# Patient Record
Sex: Male | Born: 1975 | Hispanic: Yes | Marital: Single | Smoking: Never smoker
Health system: Southern US, Community
[De-identification: ages and names within clinical notes are randomized; demographics above are authoritative.]

## PROBLEM LIST (undated history)

## (undated) DIAGNOSIS — I10 Essential (primary) hypertension: Secondary | ICD-10-CM

## (undated) DIAGNOSIS — K469 Unspecified abdominal hernia without obstruction or gangrene: Secondary | ICD-10-CM

## (undated) DIAGNOSIS — E119 Type 2 diabetes mellitus without complications: Secondary | ICD-10-CM

---

## 2014-01-29 ENCOUNTER — Emergency Department (HOSPITAL_COMMUNITY): Payer: Self-pay

## 2014-01-29 ENCOUNTER — Emergency Department (HOSPITAL_COMMUNITY)
Admission: EM | Admit: 2014-01-29 | Discharge: 2014-01-30 | Disposition: A | Discharge status: Home / Self Care | Payer: Self-pay | Attending: Emergency Medicine | Admitting: Emergency Medicine

## 2014-01-29 ENCOUNTER — Encounter (HOSPITAL_COMMUNITY): Payer: Self-pay | Admitting: Emergency Medicine

## 2014-01-29 DIAGNOSIS — I10 Essential (primary) hypertension: Secondary | ICD-10-CM | POA: Insufficient documentation

## 2014-01-29 DIAGNOSIS — K469 Unspecified abdominal hernia without obstruction or gangrene: Secondary | ICD-10-CM | POA: Insufficient documentation

## 2014-01-29 DIAGNOSIS — E119 Type 2 diabetes mellitus without complications: Secondary | ICD-10-CM | POA: Insufficient documentation

## 2014-01-29 DIAGNOSIS — K429 Umbilical hernia without obstruction or gangrene: Secondary | ICD-10-CM | POA: Insufficient documentation

## 2014-01-29 HISTORY — DX: Type 2 diabetes mellitus without complications: E11.9

## 2014-01-29 HISTORY — DX: Essential (primary) hypertension: I10

## 2014-01-29 HISTORY — DX: Unspecified abdominal hernia without obstruction or gangrene: K46.9

## 2014-01-29 LAB — CBC WITH DIFFERENTIAL/PLATELET
BASOS ABS: 0 10*3/uL (ref 0.0–0.1)
BASOS PCT: 0 % (ref 0–1)
Eosinophils Absolute: 0 10*3/uL (ref 0.0–0.7)
Eosinophils Relative: 0 % (ref 0–5)
HCT: 42.2 % (ref 39.0–52.0)
Hemoglobin: 14.3 g/dL (ref 13.0–17.0)
Lymphocytes Relative: 15 % (ref 12–46)
Lymphs Abs: 1.6 10*3/uL (ref 0.7–4.0)
MCH: 28.6 pg (ref 26.0–34.0)
MCHC: 33.9 g/dL (ref 30.0–36.0)
MCV: 84.4 fL (ref 78.0–100.0)
Monocytes Absolute: 0.4 10*3/uL (ref 0.1–1.0)
Monocytes Relative: 4 % (ref 3–12)
NEUTROS ABS: 8.4 10*3/uL — AB (ref 1.7–7.7)
Neutrophils Relative %: 81 % — ABNORMAL HIGH (ref 43–77)
Platelets: 246 10*3/uL (ref 150–400)
RBC: 5 MIL/uL (ref 4.22–5.81)
RDW: 12.5 % (ref 11.5–15.5)
WBC: 10.5 10*3/uL (ref 4.0–10.5)

## 2014-01-29 LAB — URINALYSIS, ROUTINE W REFLEX MICROSCOPIC
Bilirubin Urine: NEGATIVE
GLUCOSE, UA: NEGATIVE mg/dL
LEUKOCYTES UA: NEGATIVE
Nitrite: NEGATIVE
PH: 7 (ref 5.0–8.0)
SPECIFIC GRAVITY, URINE: 1.02 (ref 1.005–1.030)
Urobilinogen, UA: 1 mg/dL (ref 0.0–1.0)

## 2014-01-29 LAB — URINE MICROSCOPIC-ADD ON

## 2014-01-29 MED ORDER — SODIUM CHLORIDE 0.9 % IV SOLN
1000.0000 mL | Freq: Once | INTRAVENOUS | Status: AC
  Administered 2014-01-30: 1000 mL via INTRAVENOUS

## 2014-01-29 MED ORDER — MORPHINE SULFATE 4 MG/ML IJ SOLN
4.0000 mg | Freq: Once | INTRAMUSCULAR | Status: AC
  Administered 2014-01-30: 4 mg via INTRAVENOUS
  Filled 2014-01-29: qty 1

## 2014-01-29 MED ORDER — ONDANSETRON HCL 4 MG/2ML IJ SOLN
4.0000 mg | Freq: Once | INTRAMUSCULAR | Status: AC
  Administered 2014-01-30: 4 mg via INTRAVENOUS
  Filled 2014-01-29: qty 2

## 2014-01-29 MED ORDER — SODIUM CHLORIDE 0.9 % IV SOLN
1000.0000 mL | INTRAVENOUS | Status: DC
  Administered 2014-01-30: 1000 mL via INTRAVENOUS

## 2014-01-29 NOTE — ED Notes (Signed)
Pt reports lower abdominal pain. Pt states his hernia is not normally protruding out like it is now. Pt tender in the lower abdomen.

## 2014-01-29 NOTE — ED Notes (Signed)
Had the hernia for 4 years and it started bothering him real bad this morning. Denies bowel and bladder problems, states he has a little nausea, no vomiting.

## 2014-01-29 NOTE — ED Provider Notes (Signed)
CSN: [id number]     Arrival date & time 01/29/14  2021 History   First MD Initiated Contact with Patient 01/29/14 2258   This chart was scribed for Ward GivensIva L Ole Lafon, MD by Gwenevere AbbotAlexis Brown, ED scribe. This patient was seen in room APA03/APA03 and the patient's care was started at 11:28 PM.    Chief Complaint  Patient presents with  . Hernia   The history is provided by the patient and a relative. No language interpreter was used.   HPI Comments:  Christopher Compton is a 38 y.o. male who presents to the Emergency Department complaining of  periumbilical abdominal pain from a periumbilical hernia that has persisted for 5 to 6 years, but worsened today. He states normally the hernia will come out but will also go back in. Today however the hernia has stayed out and feels hard. Pt states that he began to experience increased pain, with associated symptoms of sweating and nausea at 3:00 PM. Pt reports that he did have a bowel movement today at 6:00PM. Pt states that rest improves pain. He states changing positions makes the pain worse. He denies vomiting or diarrhea. Pt states that he is able to sleep with pain as well. 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 [facility]Pt states that he was taking medication for Diabtes, but was stopped by Physician recently, now he only takes medication for HTN. Pt reports that he has never talked to surgeon before about hernia removal. Today is the first time pt reports that hernia had bothered him this severely.   PCP [facility]  Past Medical History  Diagnosis Date  . Abdominal hernia   . Hypertension   . Diabetes mellitus without complication    History reviewed. No pertinent past surgical history. No family history on file. History  Substance Use Topics  . Smoking status: Never Smoker   . Smokeless tobacco: Not on file  . Alcohol Use: No  employed  Review of Systems    Constitutional: Negative for fever and chills.  Gastrointestinal: Positive for abdominal pain. Negative for nausea, vomiting and diarrhea.      Allergies  Review of patient's allergies indicates no known allergies.  Home Medications  Fish oil BP medication Diabetes medication stopped 2-3 weeks ago  BP 127/80  Pulse 57  Temp(Src) 98.2 F (36.8 C) (Oral)  Resp 18  Ht 5\' 9"  (1.753 m)  Wt 236 lb (107.049 kg)  BMI 34.84 kg/m2  SpO2 100%  Vital signs normal except bradycardia  Physical Exam  Nursing note and vitals reviewed. Constitutional: He is oriented to person, place, and time. He appears well-developed and well-nourished.  Non-toxic appearance. He does not appear ill. He appears distressed.  HENT:  Head: Normocephalic and atraumatic.  Right Ear: External ear normal.  Left Ear: External ear normal.  Nose: Nose normal. No mucosal edema or rhinorrhea.  Mouth/Throat: Oropharynx is clear and moist and mucous membranes are normal. No dental abscesses or uvula swelling.  Eyes: Conjunctivae and EOM are normal. Pupils are equal, round, and reactive to light.  Neck: Normal range of motion and full passive range of motion without pain. Neck supple.  Cardiovascular: Normal rate, regular rhythm and normal heart sounds.  Exam reveals no gallop and no friction rub.   No murmur heard. Pulmonary/Chest: Effort normal and breath sounds normal. No respiratory distress. He has no wheezes. He has no rhonchi. He  has no rales. He exhibits no tenderness and no crepitus.  Abdominal: Soft. Normal appearance and bowel sounds are normal. He exhibits no distension. There is tenderness. There is no rebound and no guarding.  4 by 5 cm umbilical hernia, firm to touch and painful. Diffuse abdominal pain. I attempted to reduce the hernia however it did not respond.  Musculoskeletal: Normal range of motion. He exhibits no edema and no tenderness.  Moves all extremities well.   Neurological: He is alert and  oriented to person, place, and time. He has normal strength. No cranial nerve deficit.  Skin: Skin is warm, dry and intact. No rash noted. No erythema. No pallor.  Psychiatric: He has a normal mood and affect. His speech is normal and behavior is normal. His mood appears not anxious.        ED Course  Procedures   Medications  0.9 %  sodium chloride infusion (0 mLs Intravenous Stopped 01/30/14 0130)    Followed by  0.9 %  sodium chloride infusion (1,000 mLs Intravenous New Bag/Given 01/30/14 0000)  morphine 4 MG/ML injection 4 mg (4 mg Intravenous Given 01/30/14 0004)  ondansetron (ZOFRAN) injection 4 mg (4 mg Intravenous Given 01/30/14 0001)  iohexol (OMNIPAQUE) 300 MG/ML solution 50 mL (50 mLs Oral Contrast Given 01/30/14 0118)  iohexol (OMNIPAQUE) 300 MG/ML solution 100 mL (100 mLs Intravenous Contrast Given 01/30/14 0117)  HYDROmorphone (DILAUDID) injection 1 mg (1 mg Intravenous Given 01/30/14 0147)    DIAGNOSTIC STUDIES: Oxygen Saturation is 100% on RA, normal by my interpretation.  COORDINATION OF CARE: 11:34 PM-Discussed treatment plan with pt at bedside and pt agreed to plan.  01:38 Radiologist call CT results. States it appears the hernia could be reducible. I will attempt again after giving more pain medications.  02:05 Pt given more pain medications and placed in Trendelenburg position. His hernia was reduced by me.   03:00 hernia has remained reduced. Pt states his pain is better, feels ready to be discharged.  We discussed to not strain or lift heavy objects and the need to see a surgeon to have surgery done to repair the hernia.   Labs Review Results for orders placed during the hospital encounter of 01/29/14  CBC WITH DIFFERENTIAL      Result Value Ref Range   WBC 10.5  4.0 - 10.5 K/uL   RBC 5.00  4.22 - 5.81 MIL/uL   Hemoglobin 14.3  13.0 - 17.0 g/dL   HCT 16.1  09.6 - 04.5 %   MCV 84.4  78.0 - 100.0 fL   MCH 28.6  26.0 - 34.0 pg   MCHC 33.9  30.0 - 36.0 g/dL   RDW  40.9  81.1 - 91.4 %   Platelets 246  150 - 400 K/uL   Neutrophils Relative % 81 (*) 43 - 77 %   Neutro Abs 8.4 (*) 1.7 - 7.7 K/uL   Lymphocytes Relative 15  12 - 46 %   Lymphs Abs 1.6  0.7 - 4.0 K/uL   Monocytes Relative 4  3 - 12 %   Monocytes Absolute 0.4  0.1 - 1.0 K/uL   Eosinophils Relative 0  0 - 5 %   Eosinophils Absolute 0.0  0.0 - 0.7 K/uL   Basophils Relative 0  0 - 1 %   Basophils Absolute 0.0  0.0 - 0.1 K/uL  COMPREHENSIVE METABOLIC PANEL      Result Value Ref Range   Sodium 139  137 - 147 mEq/L  Potassium 4.2  3.7 - 5.3 mEq/L   Chloride 100  96 - 112 mEq/L   CO2 28  19 - 32 mEq/L   Glucose, Bld 103 (*) 70 - 99 mg/dL   BUN 15  6 - 23 mg/dL   Creatinine, Ser 1.61  0.50 - 1.35 mg/dL   Calcium 9.6  8.4 - 09.6 mg/dL   Total Protein 7.1  6.0 - 8.3 g/dL   Albumin 4.2  3.5 - 5.2 g/dL   AST 17  0 - 37 U/L   ALT 18  0 - 53 U/L   Alkaline Phosphatase 73  39 - 117 U/L   Total Bilirubin 0.4  0.3 - 1.2 mg/dL   GFR calc non Af Amer >90  >90 mL/min   GFR calc Af Amer >90  >90 mL/min   Anion gap 11  5 - 15  URINALYSIS, ROUTINE W REFLEX MICROSCOPIC      Result Value Ref Range   Color, Urine YELLOW  YELLOW   APPearance CLEAR  CLEAR   Specific Gravity, Urine 1.020  1.005 - 1.030   pH 7.0  5.0 - 8.0   Glucose, UA NEGATIVE  NEGATIVE mg/dL   Hgb urine dipstick TRACE (*) NEGATIVE   Bilirubin Urine NEGATIVE  NEGATIVE   Ketones, ur TRACE (*) NEGATIVE mg/dL   Protein, ur TRACE (*) NEGATIVE mg/dL   Urobilinogen, UA 1.0  0.0 - 1.0 mg/dL   Nitrite NEGATIVE  NEGATIVE   Leukocytes, UA NEGATIVE  NEGATIVE  URINE MICROSCOPIC-ADD ON      Result Value Ref Range   Squamous Epithelial / LPF RARE  RARE   WBC, UA 3-6  <3 WBC/hpf   RBC / HPF 7-10  <3 RBC/hpf   Bacteria, UA FEW (*) RARE   Urine-Other MUCOUS PRESENT     Laboratory interpretation all normal       Imaging Review Ct Abdomen Pelvis W Contrast  01/30/2014   CLINICAL DATA:  Abdominal pain; known umbilical hernia.  EXAM: CT  ABDOMEN AND PELVIS WITH CONTRAST  TECHNIQUE: Multidetector CT imaging of the abdomen and pelvis was performed using the standard protocol following bolus administration of intravenous contrast.  CONTRAST:  OMNIPAQUE IOHEXOL 300 MG/ML SOLN, 50mL OMNIPAQUE IOHEXOL 300 MG/ML SOLN  COMPARISON:  None.  FINDINGS: The visualized lung bases are clear.  The liver and spleen are unremarkable in appearance. The gallbladder is within normal limits. The pancreas and adrenal glands are unremarkable.  The kidneys are unremarkable in appearance. There is no evidence of hydronephrosis. No renal or ureteral stones are seen. No perinephric stranding is appreciated.  There is a small to moderate umbilical hernia, containing a short segment of dilated mid ileum, with associated free fluid. The transition point is at the distal aspect of the herniated segment. More proximal small bowel measures up to 3.4 cm in maximal diameter. This is compatible with focal bowel obstruction due to bowel herniation, and may still be manually reducible, given the somewhat broad neck of the hernia.  No additional free fluid is identified. The more distal small bowel is decompressed. The stomach is within normal limits. No acute vascular abnormalities are seen.  The appendix is normal in caliber and contains air, without evidence for appendicitis. The colon is largely decompressed and is grossly unremarkable in appearance.  The bladder is decompressed and not well assessed. The prostate is normal in size. No inguinal lymphadenopathy is seen.  No acute osseous abnormalities are identified. A prominent calcified posterior disc  protrusion is noted at L4-L5; there may be mild impression on the exiting nerve root on the right side, reflecting associated bony foraminal narrowing.  IMPRESSION: 1. Focal small-bowel obstruction noted at the mid ileum, reflecting herniation of a short segment of mid ileum into a small to moderate umbilical hernia. Associated  free fluid seen within the hernia. More proximal small bowel is dilated, up to 3.4 cm in maximal diameter. This may still be manually reducible, given the somewhat broad neck of the hernia. 2. Prominent calcified posterior disc protrusion at L4-L5; there may be mild impression on the exiting nerve root on the right side, reflecting associated bony foraminal narrowing. Would correlate for associated right lower extremity symptoms.  These results were called by telephone at the time of interpretation on 01/30/2014 at 1:45 am to Dr. Devoria Albe, who verbally acknowledged these results.   Electronically Signed   By: Roanna Raider M.D.   On: 01/30/2014 01:45     EKG Interpretation None      MDM   Final diagnoses:  Periumbilical hernia    New Prescriptions   ONDANSETRON (ZOFRAN) 4 MG TABLET    Take 1 tablet (4 mg total) by mouth every 8 (eight) hours as needed for nausea or vomiting.   OXYCODONE-ACETAMINOPHEN (PERCOCET/ROXICET) 5-325 MG PER TABLET    Take 1 tablet by mouth every 6 (six) hours as needed for moderate pain or severe pain.    Plan discharge    I personally performed the services described in this documentation, which was scribed in my presence. The recorded information has been reviewed and considered.  Devoria Albe, MD, FACEP      Ward Givens, MD 01/30/14 [phone]

## 2014-01-30 LAB — COMPREHENSIVE METABOLIC PANEL
ALBUMIN: 4.2 g/dL (ref 3.5–5.2)
ALK PHOS: 73 U/L (ref 39–117)
ALT: 18 U/L (ref 0–53)
AST: 17 U/L (ref 0–37)
Anion gap: 11 (ref 5–15)
BUN: 15 mg/dL (ref 6–23)
CHLORIDE: 100 meq/L (ref 96–112)
CO2: 28 mEq/L (ref 19–32)
CREATININE: 0.96 mg/dL (ref 0.50–1.35)
Calcium: 9.6 mg/dL (ref 8.4–10.5)
GFR calc Af Amer: >90 mL/min (ref >90)
GFR calc non Af Amer: >90 mL/min (ref >90)
Glucose, Bld: 103 mg/dL — ABNORMAL HIGH (ref 70–99)
POTASSIUM: 4.2 meq/L (ref 3.7–5.3)
Sodium: 139 mEq/L (ref 137–147)
Total Bilirubin: 0.4 mg/dL (ref 0.3–1.2)
Total Protein: 7.1 g/dL (ref 6.0–8.3)

## 2014-01-30 MED ORDER — IOHEXOL 300 MG/ML  SOLN
50.0000 mL | Freq: Once | INTRAMUSCULAR | Status: AC | PRN
  Administered 2014-01-30: 50 mL via ORAL

## 2014-01-30 MED ORDER — IOHEXOL 300 MG/ML  SOLN
100.0000 mL | Freq: Once | INTRAMUSCULAR | Status: AC | PRN
  Administered 2014-01-30: 100 mL via INTRAVENOUS

## 2014-01-30 MED ORDER — HYDROMORPHONE HCL PF 1 MG/ML IJ SOLN
1.0000 mg | Freq: Once | INTRAMUSCULAR | Status: AC
  Administered 2014-01-30: 1 mg via INTRAVENOUS
  Filled 2014-01-30: qty 1

## 2014-01-30 MED ORDER — OXYCODONE-ACETAMINOPHEN 5-325 MG PO TABS: 1.0000 | ORAL_TABLET | Freq: Four times a day (QID) | ORAL | Status: DC | PRN

## 2014-01-30 MED ORDER — ONDANSETRON HCL 4 MG PO TABS: 4.0000 mg | ORAL_TABLET | Freq: Three times a day (TID) | ORAL | Status: DC | PRN

## 2014-01-30 NOTE — ED Notes (Signed)
Pt placed in Trendelenburg position as per order.

## 2014-01-30 NOTE — Discharge Instructions (Signed)
Avoid lifting heavy objects, straining or coughing as this can make the hernia get stuck out again. Take the percocet as needed for pain. Return to the ED if you are unable to get the hernia to go away. [facility] provider [facility] to arrange to discuss having your hernia repaired by a surgeon. Return to the ED if it gets stuck out again and gets more painful.

## 2014-01-30 NOTE — ED Notes (Signed)
Pt alert & oriented x4, stable gait. Patient given discharge instructions, paperwork & prescription(s). Patient  instructed to stop at the registration desk to finish any additional paperwork. Patient verbalized understanding. Pt left department w/ no further questions. 

## 2014-02-02 ENCOUNTER — Encounter (HOSPITAL_COMMUNITY)
Admission: RE | Admit: 2014-02-02 | Discharge: 2014-02-02 | Disposition: A | Discharge status: Home / Self Care | Payer: Self-pay | Source: Ambulatory Visit | Attending: General Surgery | Admitting: General Surgery

## 2014-02-02 ENCOUNTER — Encounter (HOSPITAL_COMMUNITY): Payer: Self-pay | Admitting: Pharmacy Technician

## 2014-02-02 ENCOUNTER — Encounter (HOSPITAL_COMMUNITY): Payer: Self-pay

## 2014-02-02 DIAGNOSIS — R9431 Abnormal electrocardiogram [ECG] [EKG]: Secondary | ICD-10-CM | POA: Insufficient documentation

## 2014-02-02 DIAGNOSIS — Z01810 Encounter for preprocedural cardiovascular examination: Secondary | ICD-10-CM | POA: Insufficient documentation

## 2014-02-02 NOTE — Consult Note (Signed)
NAMJonni Compton:  [name], [name]              ACCOUNT NO.:  [id number]  MEDICAL RECORD NO.:  [id number]  LOCATION:  APA03                         FACILITY:  [facility]  PHYSICIAN:  Barbaraann BarthelWilliam Rolando Hessling, M.D. DATE OF BIRTH:  25-Feb-1976  DATE OF CONSULTATION:  02/01/2014 DATE OF DISCHARGE:  01/30/2014                                CONSULTATION   NOTE:  This is a 38 year old Latin American who [facility] and first seen in the emergency room for an umbilical hernia.  He was then referred to my office for elective repair.  Physical examination discloses a non incarcerated umbilical hernia that has been present for 6 years and is uncomfortable.  He was seen in the emergency room on January 29, 2014, and [facility]  PAST MEDICAL HISTORY:  Hypertension, diabetes, however, he has lost an enormous amount of weight and is now off his diabetes medicine and his hypertensive medicine.  He has lost 30 pounds.  He does has this umbilical hernia which he would like to get fixed and we planned to do this as soon as possible as it was giving him some discomfort.  PAST SURGICAL HISTORY:  The patient has had no previous surgery.  ALLERGIES:  He has no known allergies.  MEDICATIONS:  He is taking no medications at present.  SOCIAL HISTORY:  He is a nondrinker and nonsmoker.  PHYSICAL EXAMINATION:  VITAL SIGNS:  He is 5 feet 9 inches, weighs 242 pounds, his temperature is 97.5, pulse is 60, respirations 20, blood pressure 104/60. HEENT:  Head is normocephalic.  Eyes, extraocular movements are intact. Pupils are equal and reactive to light and accommodation.  There is no conjunctival pallor or scleral injection.  The sclera has a normal tincture.  There are no bruits appreciated.  No adenopathy.  No thyromegaly. CHEST:  Clear both anterior and posterior auscultation. HEART:  Regular rhythm. ABDOMEN:  Soft.  No CVA tenderness. There is an umbilical hernia that is stated is reducible.   I think that we will probably be able to close this without mesh as the defect itself does not seem that large. RECTAL:  Deferred.  He has no femoral or inguinal hernias. EXTREMITIES:  Within normal limits.  REVIEW OF SYSTEMS:  NEURO:  No history of migraines, seizures, or lateralizing neurological findings.  ENDOCRINE:  No history of diabetes or thyroid disease or adrenal problems.  CARDIOPULMONARY:  Nonsmoker and nondrinker.  No history of heart or lung problems.  MUSCULOSKELETAL: The patient has history of obesity. GI:  No past history of hepatitis, constipation, diarrhea, bright red rectal bleeding, melena, or history of inflammatory bowel disease or irritable bowel syndrome.  The patient has lost 30 pounds by diet.  GU: No history of frequency, dysuria, or kidney stones.  REVIEW OF HISTORY AND PHYSICAL:  Therefore, Mr. Christopher Compton is a 38 year old Latin American who will have his umbilical hernia fixed electively.  We discussed this in great detail in Spanish discussing the possibility of a need for a mesh and informed consent was obtained.     Barbaraann BarthelWilliam Wynne Rozak, M.D.     WB/MEDQ  D:  02/01/2014  T:  02/02/2014  Job:  [id number] 

## 2014-02-02 NOTE — Patient Instructions (Addendum)
Christopher Compton  02/02/2014   Your procedure is scheduled on:  02/03/14   Report to [facility] at 6:15 AM.  Call this number if you have problems the morning of surgery: [phone]   Remember:   Do not eat food or drink liquids after midnight.   Take these medicines the morning of surgery with A SIP OF WATER: *Zofran, oxycodone, lisinopril   Do not wear jewelry, make-up or nail polish.  Do not wear lotions, powders, or perfumes. You may wear deodorant.  Do not shave 48 hours prior to surgery. Men may shave face and neck.  Do not bring valuables to the hospital.  [facility] is not responsible for any belongings or valuables.               Contacts, dentures or bridgework may not be worn into surgery.  Leave suitcase in the car. After surgery it may be brought to your room.  For patients admitted to the hospital, discharge time is determined by your treatment team.               Patients discharged the day of surgery will not be allowed to drive home.  Name and phone number of your driver:   Special Instructions: Shower using CHG 2 nights before surgery and the night before surgery.  If you shower the day of surgery use CHG.  Use special wash - you have one bottle of CHG for all showers.  You should use approximately 1/3 of the bottle for each shower.   Please read over the following fact sheets that you were given: Surgical Site Infection Prevention and Anesthesia Post-op Instructions   Anestesia general  (General Anesthesia) La anestesia general produce un estado similar al sueo en el que no hay sensibilidad, por medio de la administracin de medicamentos (anestsicos). Impide estar alerta y sentir dolor durante un procedimiento mdico. El mdico indicar anestesia general si el procedimiento:   Va a Teacher, music.  Es doloroso o [employer].  Sera atemorizante para ver o escuchar.  Requiere que [employer].  Afecta la respiracin.  Puede causar una prdida  significativa de [place]. INFORME A SU MDICO SOBRE:   Alergias a alimentos o medicamentos.  Medicamentos que [place], incluyendo vitaminas, hierbas, gotas oftlmicas, medicamentos de venta libre y cremas.  Uso de corticoides (por va oral o cremas).  Problemas anteriores con anestsicos o medicamentos para adormecer, incluyendo los problemas experimentados por familiares.  Antecedentes de hemorragias o cogulos sanguneos.  Cirugas previas y tipos de anestsicos recibidos.  Posibilidad de embarazo, si corresponde.  Consumo de cigarrillos, alcohol o drogas.  Otros problemas de salud, especialmente si sufre diabetes, apnea del sueo o hipertensin arterial. RIESGOS Y COMPLICACIONES  La anestesia general rara vez causa complicaciones. Sin embargo, si hay complicaciones, pueden poner en peligro la vida. Las complicaciones son:   Infeccin pulmonar.  Ictus.  Ataque cardaco.  Despertar durante el procedimiento. Cuando esto ocurre, el paciente puede ser incapaz de moverse y Audiological scientist que est despierto. Podr sentir dolor intenso. Los [employer] y los que tengan problemas mdicos graves son ms propensos a sufrir [employer] adultos jvenes y sanos. Algunas de las complicaciones se pueden prevenir respondiendo a todas las preguntas del mdico y siguiendo todas las instrucciones previas al procedimiento. Es importante que informe a su mdico si no ha seguido [place] de las instrucciones previas al procedimiento, especialmente las relacionadas con la dieta. Cualquier alimento o lquido en el estmago pueden causar problemas cuando  se recibe anestesia general.  ANTES DEL PROCEDIMIENTO   Pregntele a su mdico si tendr que pasar la noche en el hospital. Si no pasar la noche hospitalizado, haga arreglos con un adulto para que lo lleve de vuelta a su casa y se quede con usted durante 24 horas.  Siga las instrucciones de su mdico si usted est tomando suplementos dietarios o  medicamentos. El mdico le dir cundo debe dejar de tomarlos o reducir la dosis.  No fume durante el mayor tiempo posible antes del procedimiento. Si puede, deje de fumar 3-6 semanas antes.  No tome nuevos suplementos dietticos ni medicamentos dentro de 1 semana del procedimiento, excepto que su mdico lo apruebe.  No coma dentro de las 8 horas de su procedimiento o segn las indicaciones de su mdico. Beba slo lquidos claros, como agua, caf negro (sin [place] o crema), y jugos de frutas (sin pulpa).  No beba dentro de las 3 horas del procedimiento o segn las indicaciones de su mdico.  Puede lavarse los dientes en la maana del procedimiento, pero asegrese de escupir la pasta de dientes y el agua cuando haya terminado. PROCEDIMIENTO  Recibir la anestesia a travs de [place], a travs de una va intravenosa (IV) o ambas. Un mdico que se especializa en anestesia (anestesilogo) o una enfermera especializada (enfermera especializada en anestesiologa) o ambos, permanecern con usted durante todo el procedimiento para asegurarse de que est dormido. Tambin le controlarn la presin arterial, el pulso y los niveles de oxgeno para asegurarse de que no tenga ningn problema. Una vez que est dormido, Musician un tubo o una mscara para ayudarlo a Industrial/product designer.  DESPUS DEL PROCEDIMIENTO  Lo despertarn cuando el procedimiento se haya completado. Permanecer en la sala donde se realiz el procedimiento o en un rea de recuperacin. Usted puede tener dolor de garganta si le colocaron un respirador. Tambin puede sentir:   [employer].  Debilidad.  Somnolencia.  Confusin.  Nuseas.  Fro. Estas respuestas son normales y pueden durar hasta 24 horas despus del procedimiento. El mdico le dir cundo estar listo para volver a Pensions consultant. En general, ser cuando est completamente despierto y estable.  Document Released: 10/10/2010 Document Revised: 10/25/2013 [facility] Patient Information  2015 [place], [place]. This information is not intended to replace advice given to you by your health care provider. Make sure you discuss any questions you have with your health care provider. Hernia (Hernia) Una hernia ocurre cuando un rgano interno protruye a travs de un punto debilitado de la pared del vientre (abdominal). La mayor parte de las hernias empeoran con el tiempo. Generalmente, pueden volver a Museum/gallery exhibitions officer (reducirse). Es posible que se requiera una ciruga para reparar las hernias que no pueden colocarse en Nature conservation officer. CUIDADOS EN EL HOGAR  Contine realizando las [employer].  Evite levantar objetos de ms de 10 libras (4,5kg).  Tosa suavemente y Secondary school teacher. Con el tiempo, esto:  Aumentar el tamao de la hernia.  Irritar la hernia.  Romper la reparacin de la hernia.  Deje de fumar.  No use nada apretado sobre la hernia. No mantenga la hernia adentro con un vendaje externo.  Ingiera alimentos de alto contenido de fibra (frutas, verduras, cereales integrales).  Beba suficiente lquido para mantener el pis (orina) claro o de color amarillo plido.  Tome medicamentos para ablandar las heces (ablandadores de heces) si no puede defecar (est constipado). SOLICITE AYUDA DE INMEDIATO SI:   Tiene fiebre.  Siente un dolor  en la zona baja del vientre que empeora.  Tiene Programme researcher, broadcasting/film/videomalestar estomacal (nuseas) y devuelve (vomita).  La piel comienza a abultarse.  La hernia cambia de color, se endurece o le duele.  El dolor o la inflamacin (hinchazn) alrededor de la hernia Elkoaumentan.  Defeca con mayor o menor frecuencia.  El aspecto de la materia fecal no es el normal.  La materia fecal es lquida (diarrea).  No puede volver a Electrical engineercolocar la hernia en su lugar ejerciendo una presin suave mientras se encuentra recostado. ASEGRESE DE QUE:   Comprende estas instrucciones.  Controlar su afeccin.  Recibir ayuda de inmediato si no mejora o  si empeora. Document Released: 03/31/2013 [facility] Patient Information 2015 [place]. This information is not intended to replace advice given to you by your health care provider. Make sure you discuss any questions you have with your health care provider.

## 2014-02-03 ENCOUNTER — Ambulatory Visit (HOSPITAL_COMMUNITY): Payer: Self-pay | Admitting: Anesthesiology

## 2014-02-03 ENCOUNTER — Ambulatory Visit (HOSPITAL_COMMUNITY)
Admission: RE | Admit: 2014-02-03 | Discharge: 2014-02-04 | Disposition: A | Discharge status: Home / Self Care | Payer: Self-pay | Source: Ambulatory Visit | Attending: General Surgery | Admitting: General Surgery

## 2014-02-03 ENCOUNTER — Encounter (HOSPITAL_COMMUNITY)
Admission: RE | Disposition: A | Discharge status: Home / Self Care | Payer: Self-pay | Source: Ambulatory Visit | Attending: General Surgery

## 2014-02-03 ENCOUNTER — Encounter (HOSPITAL_COMMUNITY): Payer: Self-pay | Admitting: Anesthesiology

## 2014-02-03 ENCOUNTER — Encounter (HOSPITAL_COMMUNITY): Payer: Self-pay | Admitting: *Deleted

## 2014-02-03 DIAGNOSIS — K429 Umbilical hernia without obstruction or gangrene: Secondary | ICD-10-CM | POA: Insufficient documentation

## 2014-02-03 HISTORY — PX: UMBILICAL HERNIA REPAIR: SHX196

## 2014-02-03 LAB — GLUCOSE, CAPILLARY
Glucose-Capillary: 80 mg/dL (ref 70–99)
Glucose-Capillary: 92 mg/dL (ref 70–99)

## 2014-02-03 SURGERY — REPAIR, HERNIA, UMBILICAL, ADULT
Anesthesia: General | Site: Abdomen

## 2014-02-03 MED ORDER — BACITRACIN ZINC 500 UNIT/GM EX OINT
TOPICAL_OINTMENT | CUTANEOUS | Status: DC | PRN
  Administered 2014-02-03: 1 via TOPICAL

## 2014-02-03 MED ORDER — BUPIVACAINE HCL (PF) 0.5 % IJ SOLN
INTRAMUSCULAR | Status: AC
  Filled 2014-02-03: qty 30

## 2014-02-03 MED ORDER — MORPHINE SULFATE 2 MG/ML IJ SOLN
1.0000 mg | INTRAMUSCULAR | Status: DC | PRN
  Administered 2014-02-03 – 2014-02-04 (×5): 1 mg via INTRAVENOUS
  Filled 2014-02-03 (×5): qty 1

## 2014-02-03 MED ORDER — CEFAZOLIN SODIUM-DEXTROSE 2-3 GM-% IV SOLR
INTRAVENOUS | Status: DC | PRN
  Administered 2014-02-03: 2 g via INTRAVENOUS

## 2014-02-03 MED ORDER — ONDANSETRON HCL 4 MG/2ML IJ SOLN
4.0000 mg | Freq: Four times a day (QID) | INTRAMUSCULAR | Status: DC | PRN
Start: 2014-02-03 — End: 2014-02-04

## 2014-02-03 MED ORDER — FENTANYL CITRATE 0.05 MG/ML IJ SOLN
25.0000 ug | INTRAMUSCULAR | Status: DC | PRN
  Administered 2014-02-03 (×2): 50 ug via INTRAVENOUS
  Filled 2014-02-03: qty 2

## 2014-02-03 MED ORDER — LACTATED RINGERS IV SOLN
INTRAVENOUS | Status: DC
  Administered 2014-02-03: 09:00:00 via INTRAVENOUS
  Administered 2014-02-03: 1000 mL via INTRAVENOUS

## 2014-02-03 MED ORDER — BACITRACIN ZINC 500 UNIT/GM EX OINT
TOPICAL_OINTMENT | CUTANEOUS | Status: AC
  Filled 2014-02-03: qty 0.9

## 2014-02-03 MED ORDER — ONDANSETRON HCL 4 MG/2ML IJ SOLN
4.0000 mg | Freq: Once | INTRAMUSCULAR | Status: AC
  Administered 2014-02-03: 4 mg via INTRAVENOUS

## 2014-02-03 MED ORDER — CEFAZOLIN SODIUM-DEXTROSE 2-3 GM-% IV SOLR
INTRAVENOUS | Status: AC
  Filled 2014-02-03: qty 50

## 2014-02-03 MED ORDER — GLYCOPYRROLATE 0.2 MG/ML IJ SOLN
INTRAMUSCULAR | Status: AC
  Filled 2014-02-03: qty 2

## 2014-02-03 MED ORDER — FENTANYL CITRATE 0.05 MG/ML IJ SOLN
INTRAMUSCULAR | Status: AC
  Filled 2014-02-03: qty 5

## 2014-02-03 MED ORDER — SUCCINYLCHOLINE CHLORIDE 20 MG/ML IJ SOLN
INTRAMUSCULAR | Status: DC | PRN
  Administered 2014-02-03: 175 mg via INTRAVENOUS

## 2014-02-03 MED ORDER — MIDAZOLAM HCL 2 MG/2ML IJ SOLN
1.0000 mg | INTRAMUSCULAR | Status: DC | PRN
  Administered 2014-02-03: 2 mg via INTRAVENOUS

## 2014-02-03 MED ORDER — BACITRACIN 50000 UNITS IM SOLR
INTRAMUSCULAR | Status: AC
Start: 2014-02-03 — End: 2014-02-03
  Filled 2014-02-03: qty 1

## 2014-02-03 MED ORDER — OXYCODONE-ACETAMINOPHEN 5-325 MG PO TABS
1.0000 | ORAL_TABLET | Freq: Four times a day (QID) | ORAL | Status: DC | PRN
  Administered 2014-02-03 – 2014-02-04 (×2): 1 via ORAL
  Filled 2014-02-03 (×2): qty 1

## 2014-02-03 MED ORDER — GLYCOPYRROLATE 0.2 MG/ML IJ SOLN
INTRAMUSCULAR | Status: AC
  Filled 2014-02-03: qty 1

## 2014-02-03 MED ORDER — CEFAZOLIN SODIUM-DEXTROSE 2-3 GM-% IV SOLR
2.0000 g | INTRAVENOUS | Status: DC
  Filled 2014-02-03: qty 50

## 2014-02-03 MED ORDER — MIDAZOLAM HCL 5 MG/5ML IJ SOLN
INTRAMUSCULAR | Status: DC | PRN
  Administered 2014-02-03: 2 mg via INTRAVENOUS

## 2014-02-03 MED ORDER — PROPOFOL 10 MG/ML IV EMUL
INTRAVENOUS | Status: AC
  Filled 2014-02-03: qty 20

## 2014-02-03 MED ORDER — ONDANSETRON HCL 4 MG PO TABS: 4.0000 mg | ORAL_TABLET | Freq: Four times a day (QID) | ORAL | Status: DC | PRN

## 2014-02-03 MED ORDER — 0.9 % SODIUM CHLORIDE (POUR BTL) OPTIME
TOPICAL | Status: DC | PRN
  Administered 2014-02-03: 1000 mL

## 2014-02-03 MED ORDER — ROCURONIUM BROMIDE 50 MG/5ML IV SOLN
INTRAVENOUS | Status: AC
  Filled 2014-02-03: qty 1

## 2014-02-03 MED ORDER — NEOSTIGMINE METHYLSULFATE 10 MG/10ML IV SOLN
INTRAVENOUS | Status: AC
  Filled 2014-02-03: qty 1

## 2014-02-03 MED ORDER — NEOSTIGMINE METHYLSULFATE 10 MG/10ML IV SOLN
INTRAVENOUS | Status: DC | PRN
  Administered 2014-02-03: 2 mg via INTRAVENOUS
  Administered 2014-02-03: 1 mg via INTRAVENOUS

## 2014-02-03 MED ORDER — LIDOCAINE HCL (PF) 1 % IJ SOLN
INTRAMUSCULAR | Status: AC
  Filled 2014-02-03: qty 5

## 2014-02-03 MED ORDER — MIDAZOLAM HCL 2 MG/2ML IJ SOLN
INTRAMUSCULAR | Status: AC
  Filled 2014-02-03: qty 2

## 2014-02-03 MED ORDER — SUCCINYLCHOLINE CHLORIDE 20 MG/ML IJ SOLN
INTRAMUSCULAR | Status: AC
  Filled 2014-02-03: qty 1

## 2014-02-03 MED ORDER — LIDOCAINE HCL 1 % IJ SOLN
INTRAMUSCULAR | Status: DC | PRN
  Administered 2014-02-03: 50 mg via INTRADERMAL

## 2014-02-03 MED ORDER — FENTANYL CITRATE 0.05 MG/ML IJ SOLN
INTRAMUSCULAR | Status: DC | PRN
  Administered 2014-02-03 (×3): 50 ug via INTRAVENOUS
  Administered 2014-02-03: 100 ug via INTRAVENOUS

## 2014-02-03 MED ORDER — GLYCOPYRROLATE 0.2 MG/ML IJ SOLN
0.2000 mg | Freq: Once | INTRAMUSCULAR | Status: AC
  Administered 2014-02-03: 0.2 mg via INTRAVENOUS

## 2014-02-03 MED ORDER — ONDANSETRON HCL 4 MG/2ML IJ SOLN
INTRAMUSCULAR | Status: AC
  Filled 2014-02-03: qty 2

## 2014-02-03 MED ORDER — BUPIVACAINE HCL (PF) 0.5 % IJ SOLN
INTRAMUSCULAR | Status: DC | PRN
  Administered 2014-02-03: 10 mL

## 2014-02-03 MED ORDER — GLYCOPYRROLATE 0.2 MG/ML IJ SOLN
INTRAMUSCULAR | Status: DC | PRN
  Administered 2014-02-03: 0.4 mg via INTRAVENOUS

## 2014-02-03 MED ORDER — LISINOPRIL 5 MG PO TABS
5.0000 mg | ORAL_TABLET | Freq: Every day | ORAL | Status: DC
  Administered 2014-02-04: 5 mg via ORAL
  Filled 2014-02-03: qty 1

## 2014-02-03 MED ORDER — ROCURONIUM BROMIDE 100 MG/10ML IV SOLN
INTRAVENOUS | Status: DC | PRN
  Administered 2014-02-03: 30 mg via INTRAVENOUS

## 2014-02-03 MED ORDER — PROPOFOL 10 MG/ML IV BOLUS
INTRAVENOUS | Status: DC | PRN
  Administered 2014-02-03: 170 mg via INTRAVENOUS

## 2014-02-03 MED ORDER — ONDANSETRON HCL 4 MG/2ML IJ SOLN: 4.0000 mg | Freq: Once | INTRAMUSCULAR | Status: DC | PRN

## 2014-02-03 MED ORDER — ONDANSETRON HCL 4 MG PO TABS: 4.0000 mg | ORAL_TABLET | Freq: Three times a day (TID) | ORAL | Status: DC | PRN

## 2014-02-03 MED ORDER — POTASSIUM CHLORIDE IN NACL 20-0.9 MEQ/L-% IV SOLN
INTRAVENOUS | Status: DC
Start: 2014-02-03 — End: 2014-02-04
  Administered 2014-02-03 – 2014-02-04 (×2): via INTRAVENOUS

## 2014-02-03 SURGICAL SUPPLY — 48 items
ATTRACTOMAT 16X20 MAGNETIC DRP (DRAPES) ×3 IMPLANT
BAG HAMPER (MISCELLANEOUS) ×3 IMPLANT
CLEANER TIP ELECTROSURG 2X2 (MISCELLANEOUS) ×3 IMPLANT
CLOTH BEACON ORANGE TIMEOUT ST (SAFETY) ×3 IMPLANT
COVER LIGHT HANDLE STERIS (MISCELLANEOUS) ×6 IMPLANT
DECANTER SPIKE VIAL GLASS SM (MISCELLANEOUS) ×3 IMPLANT
DURAPREP 26ML APPLICATOR (WOUND CARE) ×3 IMPLANT
ELECT REM PT RETURN 9FT ADLT (ELECTROSURGICAL) ×3
ELECTRODE REM PT RTRN 9FT ADLT (ELECTROSURGICAL) ×1 IMPLANT
FORMALIN 10 PREFIL 120ML (MISCELLANEOUS) IMPLANT
GAUZE SPONGE 4X4 12PLY STRL (GAUZE/BANDAGES/DRESSINGS) IMPLANT
GLOVE BIOGEL M 7.0 STRL (GLOVE) ×6 IMPLANT
GLOVE BIOGEL PI IND STRL 7.0 (GLOVE) ×3 IMPLANT
GLOVE BIOGEL PI INDICATOR 7.0 (GLOVE) ×6
GLOVE EXAM NITRILE MD LF STRL (GLOVE) ×6 IMPLANT
GLOVE SKINSENSE NS SZ7.0 (GLOVE) ×2
GLOVE SKINSENSE STRL SZ7.0 (GLOVE) ×1 IMPLANT
GOWN STRL REUS W/TWL LRG LVL3 (GOWN DISPOSABLE) ×9 IMPLANT
INST SET MINOR GENERAL (KITS) ×3 IMPLANT
KIT ROOM TURNOVER APOR (KITS) ×3 IMPLANT
MANIFOLD NEPTUNE II (INSTRUMENTS) ×3 IMPLANT
NEEDLE HYPO 25X1 1.5 SAFETY (NEEDLE) ×3 IMPLANT
NS IRRIG 1000ML POUR BTL (IV SOLUTION) ×3 IMPLANT
PACK MINOR (CUSTOM PROCEDURE TRAY) ×3 IMPLANT
PAD ARMBOARD 7.5X6 YLW CONV (MISCELLANEOUS) ×3 IMPLANT
SET BASIN LINEN APH (SET/KITS/TRAYS/PACK) ×3 IMPLANT
SOL PREP PROV IODINE SCRUB 4OZ (MISCELLANEOUS) IMPLANT
SPONGE GAUZE 4X4 12PLY (GAUZE/BANDAGES/DRESSINGS) ×3 IMPLANT
SPONGE INTESTINAL PEANUT (DISPOSABLE) ×3 IMPLANT
SPONGE LAP 18X18 X RAY DECT (DISPOSABLE) ×3 IMPLANT
STAPLER VISISTAT 35W (STAPLE) ×3 IMPLANT
SUT PROLENE 0 CT 1 CR/8 (SUTURE) ×3 IMPLANT
SUT PROLENE 1 CT 1 30 (SUTURE) IMPLANT
SUT PROLENE 4 0 PS 2 18 (SUTURE) ×3 IMPLANT
SUT PROLENE MO 6 BLUE #0 30IN (SUTURE) IMPLANT
SUT SILK 2 0 (SUTURE) ×2
SUT SILK 2-0 18XBRD TIE 12 (SUTURE) ×1 IMPLANT
SUT VIC AB 3-0 SH 27 (SUTURE) ×2
SUT VIC AB 3-0 SH 27X BRD (SUTURE) ×1 IMPLANT
SUT VIC AB 5-0 P-3 18X BRD (SUTURE) IMPLANT
SUT VIC AB 5-0 P3 18 (SUTURE)
SUT VICRYL AB 3 0 TIES (SUTURE) ×3 IMPLANT
SUT VICRYL AB 3-0 BRD CT 36IN (SUTURE) IMPLANT
SYR BULB IRRIGATION 50ML (SYRINGE) ×3 IMPLANT
SYR CONTROL 10ML LL (SYRINGE) ×3 IMPLANT
TAPE CLOTH SURG 4X10 WHT LF (GAUZE/BANDAGES/DRESSINGS) ×3 IMPLANT
TRAY FOLEY CATH 16FR SILVER (SET/KITS/TRAYS/PACK) ×3 IMPLANT
WATER STERILE IRR 1000ML POUR (IV SOLUTION) ×6 IMPLANT

## 2014-02-03 NOTE — Anesthesia Preprocedure Evaluation (Signed)
Anesthesia Evaluation  Patient identified by MRN, date of birth, ID band Patient awake    Reviewed: Allergy & Precautions, H&P , NPO status , Patient's Chart, lab work & pertinent test results  Airway Mallampati: III TM Distance: >3 FB Neck ROM: Full    Dental  (+) Teeth Intact   Pulmonary neg pulmonary ROS,  breath sounds clear to auscultation        Cardiovascular hypertension, Pt. on medications Rhythm:Regular Rate:Normal     Neuro/Psych    GI/Hepatic GERD-  Medicated,  Endo/Other  diabetes (diet control), Well Controlled, Type 2  Renal/GU      Musculoskeletal   Abdominal   Peds  Hematology   Anesthesia Other Findings   Reproductive/Obstetrics                           Anesthesia Physical Anesthesia Plan  ASA: II  Anesthesia Plan: General   Post-op Pain Management:    Induction: Intravenous, Rapid sequence and Cricoid pressure planned  Airway Management Planned: Oral ETT  Additional Equipment:   Intra-op Plan:   Post-operative Plan: Extubation in OR  Informed Consent: I have reviewed the patients History and Physical, chart, labs and discussed the procedure including the risks, benefits and alternatives for the proposed anesthesia with the patient or authorized representative who has indicated his/her understanding and acceptance.     Plan Discussed with:   Anesthesia Plan Comments:         Anesthesia Quick Evaluation

## 2014-02-03 NOTE — Progress Notes (Signed)
Admit via OP Dept. 37 yr. Old Male Latino for elective repair of umbilical hernia.   Procedure and risks explained in Spanish and informed consent obtained.  Labs reviewed and no significant changes clinically since H&P, dict # [id number].  Filed Vitals:   02/03/14 0649  BP: 115/70  Pulse: 60  Temp: 98.2 F (36.8 C)  Resp: 22

## 2014-02-03 NOTE — Brief Op Note (Signed)
02/03/2014  9:01 AM  PATIENT:  [name]  38 y.o. male  PRE-OPERATIVE DIAGNOSIS:  umbilical hernia  POST-OPERATIVE DIAGNOSIS:  umbilical hernia  PROCEDURE:  Procedure(s): UMBILICAL HERNIA REPAIR ADULT (N/A)  SURGEON:  Surgeon(s) and Role:    * Marlane HatcherWilliam S Rene Sizelove, MD - Primary  PHYSICIAN ASSISTANT:   ASSISTANTS: none   ANESTHESIA:   general  EBL:  Total I/O In: 1000 [I.V.:1000] Out: 825 [Urine:800; Blood:25]  BLOOD ADMINISTERED:none  DRAINS: none   LOCAL MEDICATIONS USED:  MARCAINE   0.5%  ~ 10 cc  SPECIMEN:  Source of Specimen:  umbilical hernia sac.  DISPOSITION OF SPECIMEN:  PATHOLOGY  COUNTS:  YES  TOURNIQUET:  * No tourniquets in log *  DICTATION: .Other Dictation: Dictation Number OR dict. # [id number].  PLAN OF CARE: Admit for overnight observation  PATIENT DISPOSITION:  PACU - hemodynamically stable.   Delay start of Pharmacological VTE agent (>24hrs) due to surgical blood loss or risk of bleeding: yes

## 2014-02-03 NOTE — Progress Notes (Signed)
Ur review complete.  

## 2014-02-03 NOTE — Progress Notes (Signed)
Post OP Check  Filed Vitals:   02/03/14 1300  BP: 102/62  Pulse: 58  Temp: 97.9 F (36.6 C)  Resp: 20   Dressings dry and in tact  Abdomen is soft..  Pain under control with present regimen.   Pt has voided.  Doing well post op.

## 2014-02-03 NOTE — Op Note (Signed)
NAMJonni Compton:  [name], [name]              ACCOUNT NO.:  [id number]  MEDICAL RECORD NO.:  [id number]  LOCATION:  [facility]                          FACILITY:  [facility]  PHYSICIAN:  Barbaraann BarthelWilliam Betzayda Braxton, M.D. DATE OF BIRTH:  [birth year]  DATE OF PROCEDURE: DATE OF DISCHARGE:                              OPERATIVE REPORT   DIAGNOSIS:  Umbilical hernia.  PROCEDURE:  Umbilical herniorrhaphy (no mesh required).  NOTE:  This is a 38 year old Latin American who [facility].  He was seen in the emergency room prior to [facility].  At that time, he had an incarcerated hernia which was reduced.  He was seen in my office.  We saw him and I did not want to take the chance of having this re- incarcerated and plan for surgery as soon as possible.  I have put him on a restrictive lifting and we planned for surgery via the Outpatient Department.  GROSS OPERATIVE FINDINGS:  The patient had sliding omentum within a defect approximately the size of a silver dollar.  TECHNIQUE:  The patient was placed in the supine position and after the adequate administration of general anesthesia, a Foley catheter was aseptically inserted.  His abdomen was prepped with Betadine solution and draped in usual manner.  A periumbilical incision was carried out in a curvilinear manner in the inferior portion of the umbilicus.  We dissected, free a large hernia sac, careful not to buttonhook the skin. The skin had been nicked the couple times during the shaving and prepping procedure, but there was no surgical nicks down this.  We obtained a good circumferential rim of fascia and I elected to close this with 0 Polysorb in an interrupted figure-of-eight fashion.  This was done without a problem.  We then used a 0.5% Sensorcaine to help with postoperative comfort after irrigated with normal saline solution. We then tacked the umbilicus skin to the fascial and subcutaneous  layer in order to restore its characteristics concave appearance.  We then closed the skin with a stapling device and bacitracin and 4x4, sterile dressing was applied.  Prior to closure, all sponge, needle, and instrument counts were found to be correct.  Estimated blood loss was less than 25 mL.  No drains were placed.  There were no complications. This patient will be taken to the recovery room in satisfactory condition and I will follow perioperatively after which he is to [facility] for his medical followup.    Barbaraann BarthelWilliam Kenyada Dosch, M.D.    WB/MEDQ  D:  02/03/2014  T:  02/03/2014  Job:  [id number]

## 2014-02-03 NOTE — Transfer of Care (Signed)
Immediate Anesthesia Transfer of Care Note  Patient: Christopher Compton  Procedure(s) Performed: Procedure(s): UMBILICAL HERNIA REPAIR ADULT (N/A)  Patient Location: PACU  Anesthesia Type:General  Level of Consciousness: awake and patient cooperative  Airway & Oxygen Therapy: Patient Spontanous Breathing and Patient connected to face mask oxygen  Post-op Assessment: Report given to PACU RN, Post -op Vital signs reviewed and stable and Patient moving all extremities  Post vital signs: Reviewed and stable  Complications: No apparent anesthesia complications

## 2014-02-03 NOTE — Anesthesia Postprocedure Evaluation (Signed)
  Anesthesia Post-op Note  Patient: [name]  Procedure(s) Performed: Procedure(s): UMBILICAL HERNIA REPAIR ADULT (N/A)  Patient Location: PACU  Anesthesia Type:General  Level of Consciousness: awake, alert , oriented and patient cooperative  Airway and Oxygen Therapy: Patient Spontanous Breathing  Post-op Pain: 4 /10, moderate  Post-op Assessment: Post-op Vital signs reviewed, Patient's Cardiovascular Status Stable, Respiratory Function Stable, Patent Airway and Pain level controlled  Post-op Vital Signs: Reviewed and stable  Last Vitals:  Filed Vitals:   02/03/14 0911  BP:   Pulse:   Temp: 36.7 C  Resp:     Complications: No apparent anesthesia complications

## 2014-02-03 NOTE — Progress Notes (Signed)
Received report from [place]. Patient arrived to the unit and was alert and oriented. Patient had no complaints at the time. Patients VS were within normal limits. Patients dressing was clean dry and intact with a scant amount of drainage noted and was marked. Bed was placed at lowest level and call bell was within reach. Will continue to monitor patient at this time.

## 2014-02-03 NOTE — Anesthesia Procedure Notes (Signed)
Procedure Name: Intubation Date/Time: 02/03/2014 7:44 AM Performed by: Despina HiddenIDACAVAGE, Bertrum Helmstetter J Pre-anesthesia Checklist: Patient being monitored, Suction available, Emergency Drugs available and Patient identified Patient Re-evaluated:Patient Re-evaluated prior to inductionOxygen Delivery Method: Circle system utilized Preoxygenation: Pre-oxygenation with 100% oxygen Intubation Type: IV induction, Rapid sequence and Cricoid Pressure applied Ventilation: Mask ventilation without difficulty and Oral airway inserted - appropriate to patient size Laryngoscope Size: Mac and 3 Grade View: Grade II Tube type: Oral Tube size: 8.0 mm Number of attempts: 1 Airway Equipment and Method: Stylet and Oral airway Placement Confirmation: ETT inserted through vocal cords under direct vision,  positive ETCO2 and breath sounds checked- equal and bilateral Secured at: 24 cm Tube secured with: Tape Dental Injury: Teeth and Oropharynx as per pre-operative assessment  Difficulty Due To: Difficulty was anticipated and Difficult Airway- due to large tongue Future Recommendations: Recommend- induction with short-acting agent, and alternative techniques readily available

## 2014-02-04 ENCOUNTER — Encounter (HOSPITAL_COMMUNITY): Payer: Self-pay | Admitting: General Surgery

## 2014-02-04 NOTE — Discharge Instructions (Signed)
Reparacin de la hernia, cuidados posteriores a la ciruga (Hernia Repair, Care After) Por favor lea detenidamente las siguientes indicaciones. Estas instrucciones le darn informacin acerca de qu hacer al Tech Data Corporationdejar el hospital. El profesional que la asiste podr darle instrucciones especficas. Si tiene problemas o surgen preguntas luego de recibir el alta, por favor comunquese con su mdico. CUIDADOS EN EL HOGAR  Podr tener cambios al mover el intestino.  Usted puede tener materia fecal blanda o acuosas.  Puede ser que no pueda ir de cuerpo.  Los intestinos volvern lentamente a la normalidad.  Puede ser que no sienta deseos de comer. Si hay un alimento que le produce nuseas, no lo coma. Coma pequeas porciones 4 a 6 veces al Geophysical data processorda en lugar de 3 comidas importantes.  No beba gaseosas. Esto le producir gases.  No beba alcohol.  No levante objetos que pesen ms de 10 libras (4,54 Kg.). Esto es aproximadamente el peso de un galn de [place] (3, 780 l).  No haga nada que le produzca cansancio durante por lo menos 6 semanas.  No moje la herida [employer] Communicationspor dos das.  Durante este tiempo puede tomar un bao con esponja.  Pasados los 2 [place] puede tomar [place] d[place]. Luego de la ducha, seque la herida con una toalla dando palmaditas. No la frote.  Para hombres: Es posible que le hayan entregado un suspensor (soporte escrotal) antes de salir del hospital. Esto mantiene el escroto y los testculos ms cerca de su cuerpo por lo tanto no hay presin sobre la herida. Use el suspensor hasta que su mdico le indique que no lo necesita ms. SOLICITE AYUDA DE INMEDIATO SI:  La materia fecal es acuosa, o no puede ir de cuerpo durante ms de Kinder Morgan Energytres das.  Siente nuseas o vomita mas de 2  3 veces.  Tiene fiebre de 38,9 C (102 F) o ms.  Observa enrojecimiento o hinchazn alrededor de la herida.  Observa un lquido amarillento o verdoso, o pus que proviene de la herida.  Observa un bulto o protuberancia  en la parte inferior del abdomen o cerca de su ingle.  Aparece una erupcin, dificultad para respirar o cualquier otro sntoma provocado por el medicamento que toma. ASEGRESE QUE:   Comprende estas instrucciones.  Controlar su enfermedad.  Solicitar ayuda inmediatamente si no mejora o si empeora. Document Released: 09/06/2008 Document Revised: 09/02/2011 [facility]ExitCare Patient Information 2015 [employer], [place]. This information is not intended to replace advice given to you by your health care provider. Make sure you discuss any questions you have with your health care provider.

## 2014-02-04 NOTE — Addendum Note (Signed)
Addendum created 02/04/14 0724 by Earleen NewportAmy A Adams, CRNA   Modules edited: Notes Section   Notes Section:  File: [id number]

## 2014-02-04 NOTE — Progress Notes (Signed)
02/04/14 1151 Reviewed discharge instructions with patient. Given copy of instructions, noted when home medications next due on list. Prescription given to patient per MD. IV site d/c'd per C. Raul DelAlston, RN, site within normal limits. Discussed post-op education, incision care, when to seek medical attention. Provided post-op hernia repair education sheet with AVS. Pt verbalized understanding of instructions. Pt in stable condition awaiting family arrival for discharge home. Earnstine RegalAshley Farzad Tibbetts, RN

## 2014-02-04 NOTE — Progress Notes (Signed)
02/04/14 1452 Patient left floor in stable condition this afternoon via w/c accompanied by nurse tech. Earnstine RegalAshley Ronzell Laban, RN

## 2014-02-04 NOTE — Discharge Summary (Signed)
[name], Christopher              ACCOUNT NO.:  [id number]  MEDICAL RECORD NO.:  [id number]  LOCATION:  A307                          FACILITY:  [facility]  PHYSICIAN:  Barbaraann BarthelWilliam Annalyssa Thune, M.D. DATE OF BIRTH:  02/21/1976  DATE OF ADMISSION:  02/03/2014 DATE OF DISCHARGE:  08/14/2015LH                              DISCHARGE SUMMARY   PROCEDURE:  On February 03, 2014 repair of umbilical hernia (without mesh).  HISTORY:  This is a 38 year old Latin American, who [facility], prior to that from the emergency room with a reducible umbilical hernia.  We planned for repair via the outpatient department. The patient was admitted via the outpatient department and an uneventful umbilical herniorrhaphy was carried out.  This was the defect that did not require mesh.  The patient's hospital course was completely unremarkable.  He had some expected incisional discomfort.  However, there were no other problems. At time of discharge, his wound was clean.  He was voiding well.  He had minimal abdominal discomfort and was taking p.o. without a problem.  We will follow up with him perioperatively after which [facility].  He has been told to follow up with us if he has any problems in the interim and everything was explained to him by me in Spanish and follow up was arranged.  I appreciate the opportunity to help in the [facility].     Barbaraann BarthelWilliam Demika Langenderfer, M.D.     WB/MEDQ  D:  02/04/2014  T:  02/04/2014  Job:  [id number]  cc:   [facility]  Dr. Alveta HeimlichKnapp  [facility]

## 2014-02-04 NOTE — Progress Notes (Signed)
UR review complete.  

## 2014-02-04 NOTE — Plan of Care (Signed)
Problem: Phase II Progression Outcomes Goal: Progressing with IS, TCDB Outcome: Adequate for Discharge 02/04/14 1145 Patient demonstrates correct use of IS and turn, cough, deep breathing, encouraged him to continue with these at home once discharged. Pt verbalizes understanding. Devin GoingA. Aulden Calise, RN

## 2014-02-04 NOTE — Progress Notes (Signed)
POD # 1  Filed Vitals:   02/04/14 0629  BP: 108/63  Pulse: 56  Temp: 98.4 F (36.9 C)  Resp: 11        Pt has expected incisional pain but this has been under control.  Wound clean and redressed.   Pt has voided without dysuria.  Discharge and follow up arranged.  Discharge dict. # [id number].

## 2014-02-04 NOTE — Care Management Note (Signed)
    Page 1 of 1   02/04/2014     11:31:58 AM CARE MANAGEMENT NOTE 02/04/2014  Patient:  Christopher Compton,Christopher Compton   Account Number:  [id number]  Date Initiated:  02/04/2014  Documentation initiated by:  Kathyrn SheriffHILDRESS,JESSICA  Subjective/Objective Assessment:   Patient OIB following hernia repair. Pt from home with self care.     Action/Plan:   Patient to discharge home today with follow up with surgeon. No CM needs at this time.   Anticipated DC Date:  02/04/2014   Anticipated DC Plan:  HOME/SELF CARE      DC Planning Services  CM consult      Choice offered to / List presented to:             Status of service:  Completed, signed off Medicare Important Message given?   (If response is "NO", the following Medicare IM given date fields will be blank) Date Medicare IM given:   Medicare IM given by:   Date Additional Medicare IM given:   Additional Medicare IM given by:    Discharge Disposition:  HOME/SELF CARE  Per UR Regulation:    If discussed at Long Length of Stay Meetings, dates discussed:    Comments:  02/04/2014 1130 Kathyrn SheriffJessica Childress, RN, MSN, [facility] CenterCCN

## 2014-02-04 NOTE — Anesthesia Postprocedure Evaluation (Signed)
  Anesthesia Post-op Note  Patient: Christopher DurhamAlejando [name]  Procedure(s) Performed: Procedure(s): UMBILICAL HERNIA REPAIR ADULT (N/A)  Patient Location: Room 307  Anesthesia Type:General  Level of Consciousness: awake, alert , oriented and patient cooperative  Airway and Oxygen Therapy: Patient Spontanous Breathing  Post-op Pain: mild  Post-op Assessment: Post-op Vital signs reviewed, Patient's Cardiovascular Status Stable, Respiratory Function Stable, No signs of Nausea or vomiting and Pain level controlled  Post-op Vital Signs: Reviewed and stable  Last Vitals:  Filed Vitals:   02/04/14 0629  BP: 108/63  Pulse: 56  Temp: 36.9 C  Resp: 11    Complications: No apparent anesthesia complications

## 2015-01-06 IMAGING — CT CT ABD-PELV W/ CM
2 of 4 series · 15 of 46 positions shown, 17 images · IV contrast (Omnipaque 300)
Comparison: None.

CLINICAL DATA: Abdominal pain; known umbilical hernia.

EXAM:
CT ABDOMEN AND PELVIS WITH CONTRAST
TECHNIQUE: Multidetector CT imaging of the abdomen and pelvis was performed
using the standard protocol following bolus administration of
intravenous contrast.
CONTRAST:  100mL OMNIPAQUE IOHEXOL 300 MG/ML SOLN, 50mL OMNIPAQUE
IOHEXOL 300 MG/ML SOLN

[Series 2: abd_pel_with 5.0 b40f · axial · 0.80mm/px · z∈[-477,-22]mm · 12 of 101 slices shown, 14 images]
[im 5/101  soft-tissue]
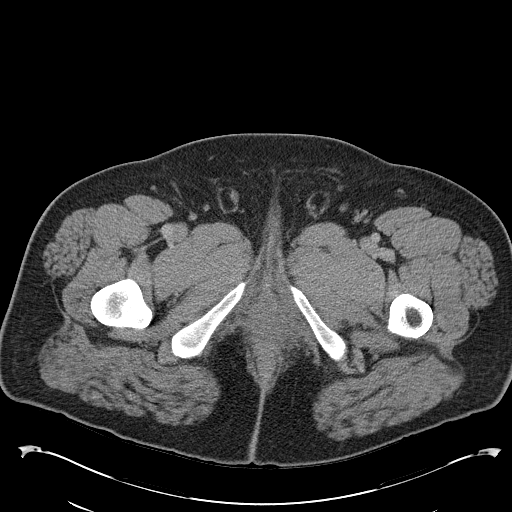
[im 5/101  bone]
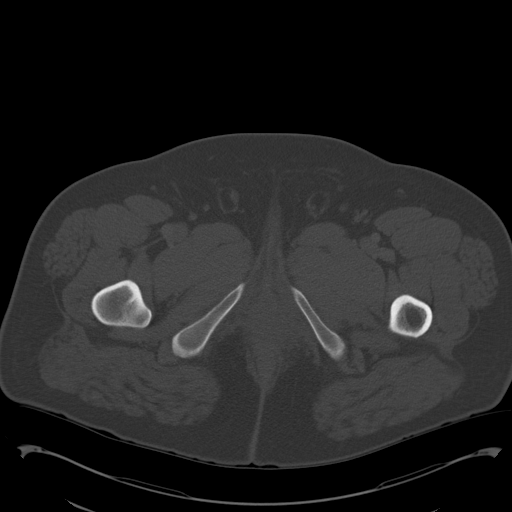
[im 13/101  soft-tissue]
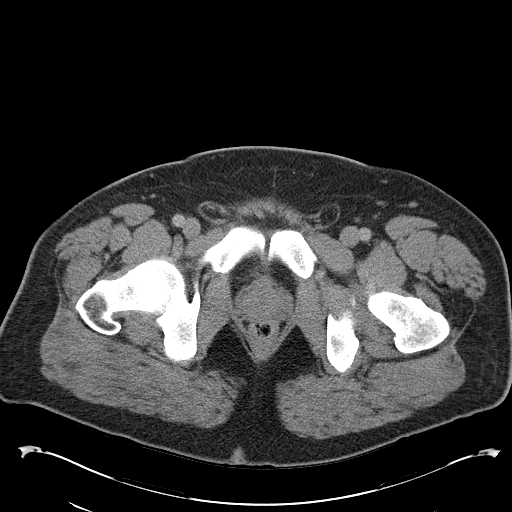
[im 21/101  soft-tissue]
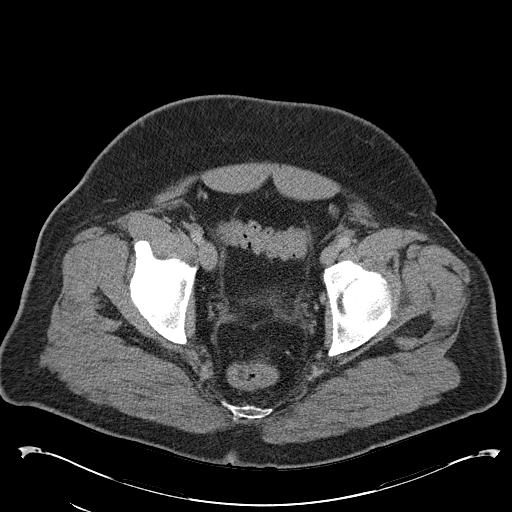
[im 30/101  soft-tissue]
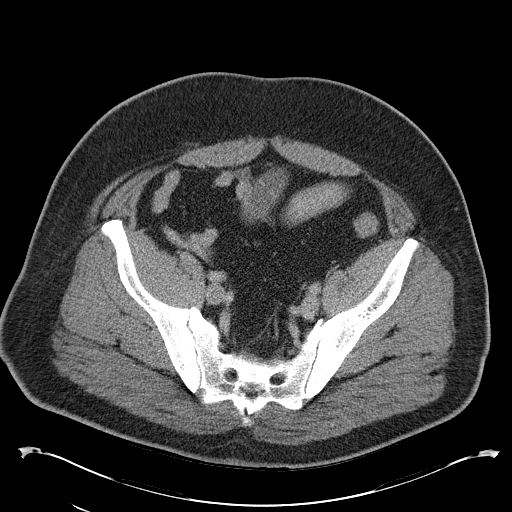
[im 38/101  soft-tissue]
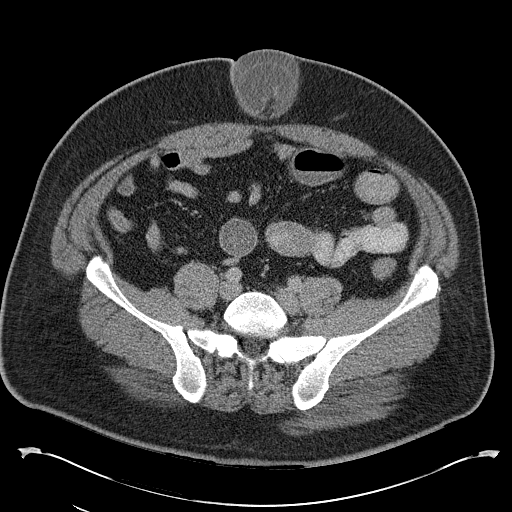
[im 46/101  soft-tissue]
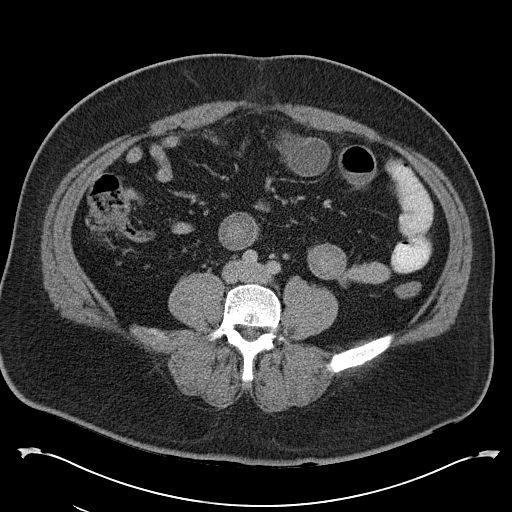
[im 55/101  soft-tissue]
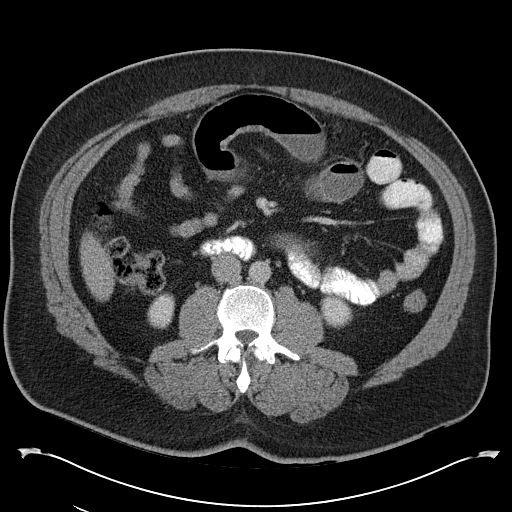
[im 63/101  soft-tissue]
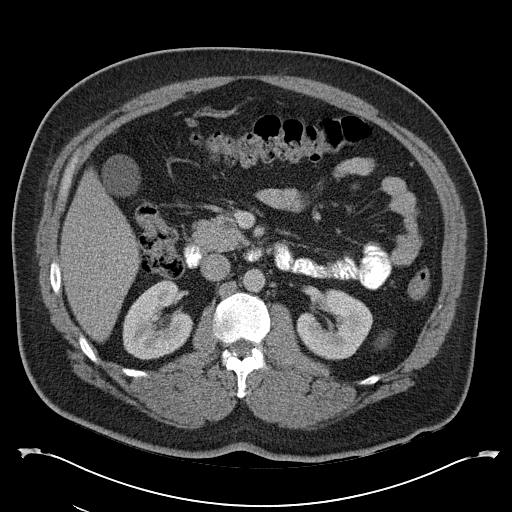
[im 71/101  soft-tissue]
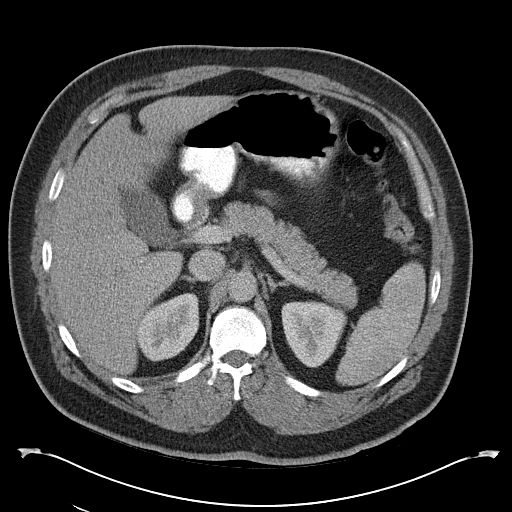
[im 71/101  bone]
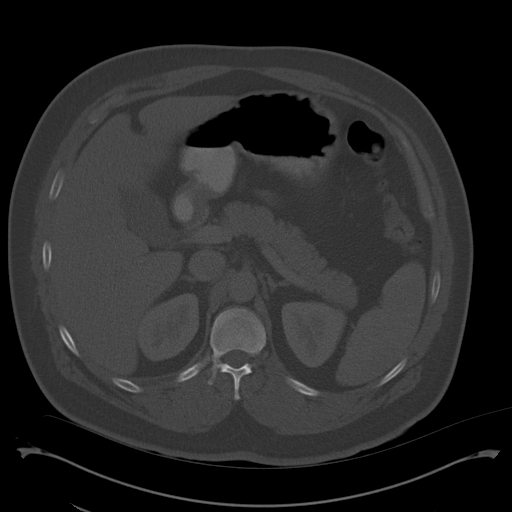
[im 80/101  soft-tissue]
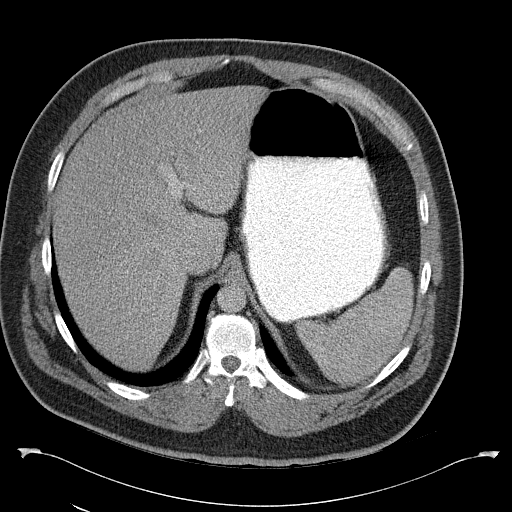
[im 88/101  soft-tissue]
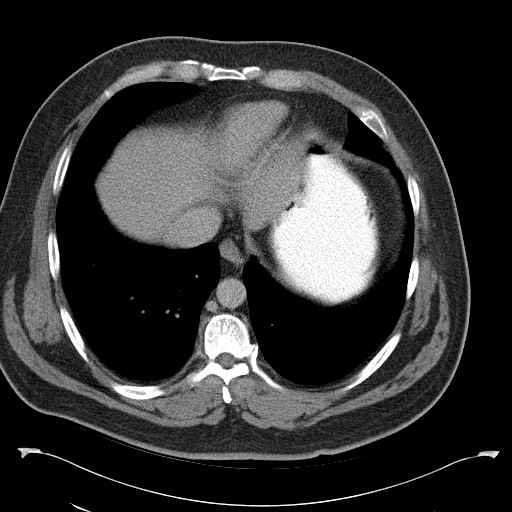
[im 96/101  soft-tissue]
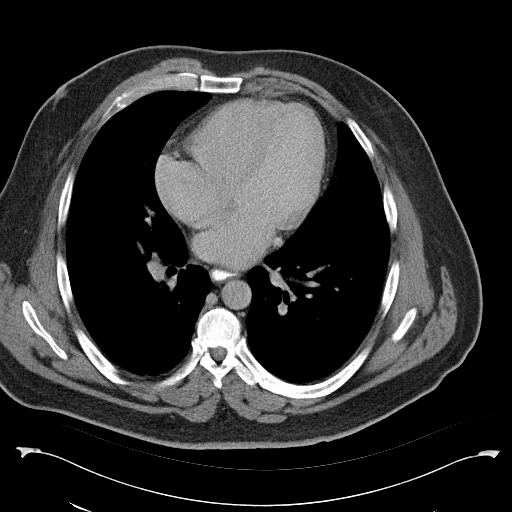

[Series 3: abd_pel_with 3.0 spo cor · coronal · 0.78mm/px · 3 of 115 slices shown]
[im 39/115  soft-tissue]
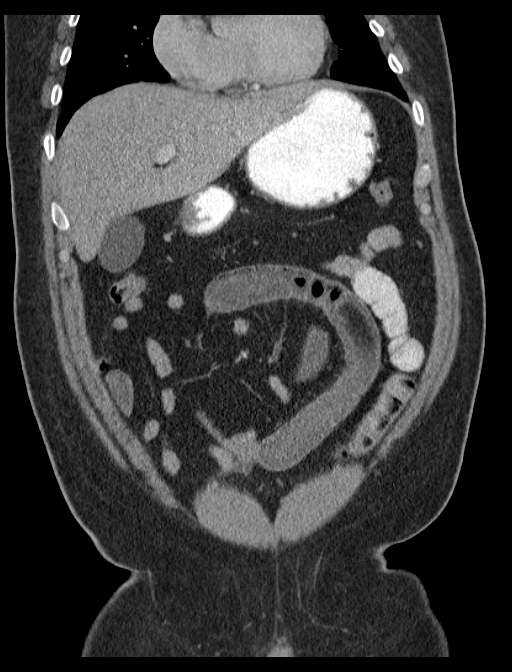
[im 51/115  soft-tissue]
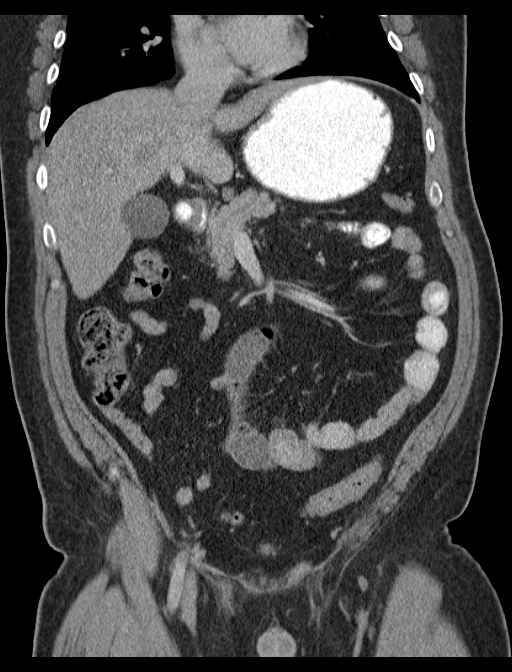
[im 64/115  soft-tissue]
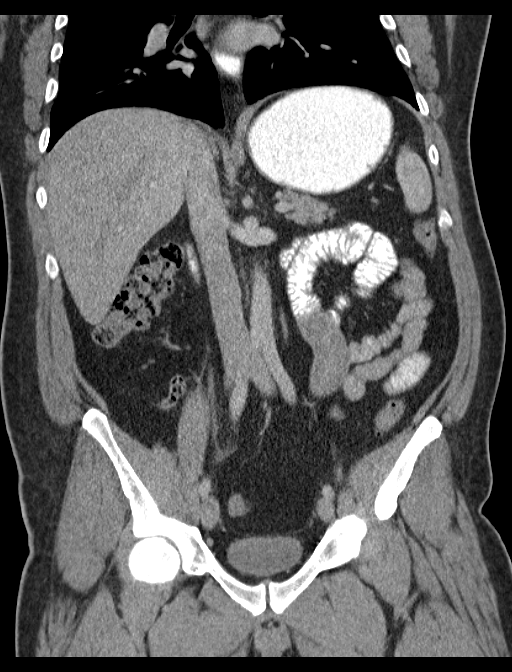

[15 of 46 positions shown; findings below may reference images not displayed]

FINDINGS: The visualized lung bases are clear.

The liver and spleen are unremarkable in appearance. The gallbladder
is within normal limits. The pancreas and adrenal glands are
unremarkable.

The kidneys are unremarkable in appearance. There is no evidence of
hydronephrosis. No renal or ureteral stones are seen. No perinephric
stranding is appreciated.

There is a small to moderate umbilical hernia, containing a short
segment of dilated mid ileum, with associated free fluid. The
transition point is at the distal aspect of the herniated segment.
More proximal small bowel measures up to 3.4 cm in maximal diameter.
This is compatible with focal bowel obstruction due to bowel
herniation, and may still be manually reducible, given the somewhat
broad neck of the hernia.

No additional free fluid is identified. The more distal small bowel
is decompressed. The stomach is within normal limits. No acute
vascular abnormalities are seen.

The appendix is normal in caliber and contains air, without evidence
for appendicitis. The colon is largely decompressed and is grossly
unremarkable in appearance.

The bladder is decompressed and not well assessed. The prostate is
normal in size. No inguinal lymphadenopathy is seen.

No acute osseous abnormalities are identified. A prominent calcified
posterior disc protrusion is noted at L4-L5; there may be mild
impression on the exiting nerve root on the right side, reflecting
associated bony foraminal narrowing.
IMPRESSION: 1. Focal small-bowel obstruction noted at the mid ileum, reflecting
herniation of a short segment of mid ileum into a small to moderate
umbilical hernia. Associated free fluid seen within the hernia. More
proximal small bowel is dilated, up to 3.4 cm in maximal diameter.
This may still be manually reducible, given the somewhat broad neck
of the hernia.
2. Prominent calcified posterior disc protrusion at L4-L5; there may
be mild impression on the exiting nerve root on the right side,
reflecting associated bony foraminal narrowing. Would correlate for
associated right lower extremity symptoms.

These results were called by telephone at the time of interpretation
on 01/30/2014 at [DATE] to Dr. KERRIE MING, who verbally acknowledged
these results.

## 2015-07-08 ENCOUNTER — Other Ambulatory Visit: Payer: Self-pay | Admitting: Physician Assistant

## 2015-07-08 LAB — COMPREHENSIVE METABOLIC PANEL
ALBUMIN: 4.3 g/dL (ref 3.6–5.1)
ALK PHOS: 68 U/L (ref 40–115)
ALT: 17 U/L (ref 9–46)
AST: 19 U/L (ref 10–40)
BILIRUBIN TOTAL: 0.6 mg/dL (ref 0.2–1.2)
BUN: 16 mg/dL (ref 7–25)
CALCIUM: 9.6 mg/dL (ref 8.6–10.3)
CO2: 26 mmol/L (ref 20–31)
Chloride: 104 mmol/L (ref 98–110)
Creat: 0.96 mg/dL (ref 0.60–1.35)
Glucose, Bld: 95 mg/dL (ref 65–99)
POTASSIUM: 4.5 mmol/L (ref 3.5–5.3)
Sodium: 138 mmol/L (ref 135–146)
TOTAL PROTEIN: 6.8 g/dL (ref 6.1–8.1)

## 2015-07-08 LAB — HEMOGLOBIN A1C
Hgb A1c MFr Bld: 6 % — ABNORMAL HIGH (ref <5.7)
Mean Plasma Glucose: 126 mg/dL — ABNORMAL HIGH (ref <117)

## 2015-07-08 LAB — LIPID PANEL
CHOL/HDL RATIO: 4.2 ratio (ref <=5.0)
CHOLESTEROL: 155 mg/dL (ref 125–200)
HDL: 37 mg/dL — AB (ref >=40)
LDL Cholesterol: 98 mg/dL (ref <130)
TRIGLYCERIDES: 98 mg/dL (ref <150)
VLDL: 20 mg/dL (ref <30)

## 2015-07-08 LAB — CBC
HCT: 41.5 % (ref 39.0–52.0)
Hemoglobin: 14.1 g/dL (ref 13.0–17.0)
MCH: 27.9 pg (ref 26.0–34.0)
MCHC: 34 g/dL (ref 30.0–36.0)
MCV: 82.2 fL (ref 78.0–100.0)
MPV: 10.2 fL (ref 8.6–12.4)
PLATELETS: 265 10*3/uL (ref 150–400)
RBC: 5.05 MIL/uL (ref 4.22–5.81)
RDW: 12.8 % (ref 11.5–15.5)
WBC: 6.5 10*3/uL (ref 4.0–10.5)

## 2015-07-12 ENCOUNTER — Encounter: Payer: Self-pay | Admitting: Physician Assistant

## 2015-07-12 ENCOUNTER — Ambulatory Visit: Payer: Self-pay | Admitting: Physician Assistant

## 2015-07-12 DIAGNOSIS — E785 Hyperlipidemia, unspecified: Secondary | ICD-10-CM

## 2015-07-12 DIAGNOSIS — R7303 Prediabetes: Secondary | ICD-10-CM

## 2015-07-13 NOTE — Progress Notes (Signed)
BP 120/78 mmHg  Pulse 62  Temp(Src) 98.1 F (36.7 C)  Ht 5' 6.5" (1.689 m)  Wt 253 lb (114.76 kg)  BMI 40.23 kg/m2  SpO2 94%   Subjective:    Patient ID: Christopher Compton, male    DOB: [birth year], 40 y.o.   MRN: [id number]  HPI: Christopher Compton is a 40 y.o. male presenting on 07/12/2015 for Hyperlipidemia   HPI   Pt is doing well.  He is continuing to watch what he eats and exercises regularly.  Relevant past medical, surgical, family and social history reviewed and updated as indicated. Interim medical history since our last visit reviewed. Allergies and medications reviewed and updated.  Current outpatient prescriptions:  .  Omega-3 Fatty Acids (FISH OIL) 1200 MG CAPS, Take 1 capsule by mouth daily., Disp: , Rfl:    Review of Systems  Constitutional: Negative for fever, chills, diaphoresis, appetite change, fatigue and unexpected weight change.  HENT: Positive for sneezing. Negative for congestion, dental problem, drooling, ear pain, facial swelling, hearing loss, mouth sores, sore throat, trouble swallowing and voice change.   Eyes: Negative for pain, discharge, redness, itching and visual disturbance.  Respiratory: Negative for cough, choking, shortness of breath and wheezing.   Cardiovascular: Negative for chest pain, palpitations and leg swelling.  Gastrointestinal: Negative for vomiting, abdominal pain, diarrhea, constipation and blood in stool.  Endocrine: Negative for cold intolerance, heat intolerance and polydipsia.  Genitourinary: Negative for dysuria, hematuria and decreased urine volume.  Musculoskeletal: Positive for back pain. Negative for arthralgias and gait problem.  Skin: Negative for rash.  Allergic/Immunologic: Positive for environmental allergies.  Neurological: Negative for seizures, syncope, light-headedness and headaches.  Hematological: Negative for adenopathy.  Psychiatric/Behavioral: Negative for suicidal ideas, dysphoric mood and  agitation. The patient is not nervous/anxious.     Per HPI unless specifically indicated above     Objective:    BP 120/78 mmHg  Pulse 62  Temp(Src) 98.1 F (36.7 C)  Ht 5' 6.5" (1.689 m)  Wt 253 lb (114.76 kg)  BMI 40.23 kg/m2  SpO2 94%  Wt Readings from Last 3 Encounters:  07/12/15 253 lb (114.76 kg)  02/03/14 242 lb 0.7 oz (109.79 kg)  02/02/14 242 lb (109.77 kg)    Physical Exam  Constitutional: He is oriented to person, place, and time. He appears well-developed and well-nourished.  HENT:  Head: Normocephalic and atraumatic.  Neck: Neck supple.  Cardiovascular: Normal rate and regular rhythm.   Pulmonary/Chest: Effort normal and breath sounds normal. He has no wheezes.  Abdominal: Soft. Bowel sounds are normal. There is no tenderness.  Musculoskeletal: He exhibits no edema.  Lymphadenopathy:    He has no cervical adenopathy.  Neurological: He is alert and oriented to person, place, and time.  Skin: Skin is warm and dry.  Psychiatric: He has a normal mood and affect. His behavior is normal.  Vitals reviewed.   Results for orders placed or performed in visit on 07/08/15  CBC  Result Value Ref Range   WBC 6.5 4.0 - 10.5 K/uL   RBC 5.05 4.22 - 5.81 MIL/uL   Hemoglobin 14.1 13.0 - 17.0 g/dL   HCT 65.7 84.6 - 96.2 %   MCV 82.2 78.0 - 100.0 fL   MCH 27.9 26.0 - 34.0 pg   MCHC 34.0 30.0 - 36.0 g/dL   RDW 95.2 84.1 - 32.4 %   Platelets 265 150 - 400 K/uL   MPV 10.2 8.6 - 12.4 fL  Comprehensive metabolic panel  Result Value Ref Range   Sodium 138 135 - 146 mmol/L   Potassium 4.5 3.5 - 5.3 mmol/L   Chloride 104 98 - 110 mmol/L   CO2 26 20 - 31 mmol/L   Glucose, Bld 95 65 - 99 mg/dL   BUN 16 7 - 25 mg/dL   Creat 0.45 4.09 - 8.11 mg/dL   Total Bilirubin 0.6 0.2 - 1.2 mg/dL   Alkaline Phosphatase 68 40 - 115 U/L   AST 19 10 - 40 U/L   ALT 17 9 - 46 U/L   Total Protein 6.8 6.1 - 8.1 g/dL   Albumin 4.3 3.6 - 5.1 g/dL   Calcium 9.6 8.6 - 91.4 mg/dL  Lipid panel   Result Value Ref Range   Cholesterol 155 125 - 200 mg/dL   Triglycerides 98 <782 mg/dL   HDL 37 (L) >=95 mg/dL   Total CHOL/HDL Ratio 4.2 <=5.0 Ratio   VLDL 20 <30 mg/dL   LDL Cholesterol 98 <621 mg/dL  Hemoglobin H0Q  Result Value Ref Range   Hgb A1c MFr Bld 6.0 (H) <5.7 %   Mean Plasma Glucose 126 (H) <117 mg/dL      Assessment & Plan:   Encounter Diagnoses  Name Primary?  . Morbid obesity, unspecified obesity type (HCC) Yes  . Hyperlipidemia   . Prediabetes      -reviewed labs with pt. -continue to eat healthfully and exercise regularly -f/u 1 year.  RTO sooner prn

## 2015-07-16 DIAGNOSIS — R7303 Prediabetes: Secondary | ICD-10-CM | POA: Insufficient documentation

## 2015-07-16 DIAGNOSIS — E119 Type 2 diabetes mellitus without complications: Secondary | ICD-10-CM | POA: Insufficient documentation

## 2015-07-16 DIAGNOSIS — E785 Hyperlipidemia, unspecified: Secondary | ICD-10-CM | POA: Insufficient documentation

## 2016-07-03 ENCOUNTER — Other Ambulatory Visit: Payer: Self-pay | Admitting: Student

## 2016-07-03 DIAGNOSIS — E785 Hyperlipidemia, unspecified: Secondary | ICD-10-CM

## 2016-07-03 DIAGNOSIS — R7303 Prediabetes: Secondary | ICD-10-CM

## 2016-07-06 LAB — LIPID PANEL
CHOL/HDL RATIO: 3.9 ratio (ref <5.0)
CHOLESTEROL: 156 mg/dL (ref <200)
HDL: 40 mg/dL — AB (ref >40)
LDL Cholesterol: 96 mg/dL (ref <100)
TRIGLYCERIDES: 98 mg/dL (ref <150)
VLDL: 20 mg/dL (ref <30)

## 2016-07-06 LAB — COMPLETE METABOLIC PANEL WITH GFR
ALBUMIN: 4.2 g/dL (ref 3.6–5.1)
ALK PHOS: 66 U/L (ref 40–115)
ALT: 20 U/L (ref 9–46)
AST: 24 U/L (ref 10–40)
BUN: 14 mg/dL (ref 7–25)
CO2: 25 mmol/L (ref 20–31)
Calcium: 9.4 mg/dL (ref 8.6–10.3)
Chloride: 106 mmol/L (ref 98–110)
Creat: 1.01 mg/dL (ref 0.60–1.35)
GFR, Est African American: >89 mL/min (ref >=60)
GFR, Est Non African American: >89 mL/min (ref >=60)
GLUCOSE: 89 mg/dL (ref 65–99)
POTASSIUM: 4.4 mmol/L (ref 3.5–5.3)
SODIUM: 141 mmol/L (ref 135–146)
Total Bilirubin: 0.8 mg/dL (ref 0.2–1.2)
Total Protein: 6.9 g/dL (ref 6.1–8.1)

## 2016-07-08 LAB — HEMOGLOBIN A1C
Hgb A1c MFr Bld: 5.6 % (ref <5.7)
MEAN PLASMA GLUCOSE: 114 mg/dL

## 2016-07-11 ENCOUNTER — Ambulatory Visit: Payer: Self-pay | Admitting: Physician Assistant

## 2016-07-17 ENCOUNTER — Ambulatory Visit: Payer: Self-pay | Admitting: Physician Assistant

## 2016-07-17 ENCOUNTER — Encounter: Payer: Self-pay | Admitting: Physician Assistant

## 2016-07-17 VITALS — BP 120/76 | HR 71 | Temp 97.9°F | Ht 66.5 in | Wt 250.0 lb

## 2016-07-17 DIAGNOSIS — Z6839 Body mass index (BMI) 39.0-39.9, adult: Principal | ICD-10-CM

## 2016-07-17 DIAGNOSIS — E669 Obesity, unspecified: Secondary | ICD-10-CM

## 2016-07-17 DIAGNOSIS — Z0000 Encounter for general adult medical examination without abnormal findings: Secondary | ICD-10-CM

## 2016-07-17 DIAGNOSIS — B351 Tinea unguium: Secondary | ICD-10-CM

## 2016-07-17 MED ORDER — TERBINAFINE HCL 250 MG PO TABS: ORAL_TABLET | ORAL | 1 refills | Status: DC

## 2016-07-17 NOTE — Progress Notes (Signed)
BP 120/76 (BP Location: Left Arm, Patient Position: Sitting, Cuff Size: Normal)   Pulse 71   Temp 97.9 F (36.6 C) (Other (Comment))   Ht 5' 6.5" (1.689 m)   Wt 250 lb (113.4 kg)   SpO2 97%   BMI 39.75 kg/m    Subjective:    Patient ID: Christopher Compton, male    DOB: 1975-10-08, 41 y.o.   MRN: [id number]  HPI: Christopher Compton is a 41 y.o. male presenting on 07/17/2016 for Hyperlipidemia   HPI   Pt states hurt back about a month ago and it is improved now and he no longer has pain.   Says he woke up that way one day.   He says he is doing well and is continuing to eat nutritiously and exercise regularly.  Pt states he has toenail fungus and requests treatment  Relevant past medical, surgical, family and social history reviewed and updated as indicated. Interim medical history since our last visit reviewed. Allergies and medications reviewed and updated.   Current Outpatient Prescriptions:  .  Omega-3 Fatty Acids (FISH OIL) 1200 MG CAPS, Take 1 capsule by mouth daily., Disp: , Rfl:    Review of Systems  Constitutional: Negative for appetite change, chills, diaphoresis, fatigue, fever and unexpected weight change.  HENT: Negative for congestion, dental problem, drooling, ear pain, facial swelling, hearing loss, mouth sores, sneezing, sore throat, trouble swallowing and voice change.   Eyes: Negative for pain, discharge, redness, itching and visual disturbance.  Respiratory: Negative for cough, choking, shortness of breath and wheezing.   Cardiovascular: Negative for chest pain, palpitations and leg swelling.  Gastrointestinal: Negative for abdominal pain, blood in stool, constipation, diarrhea and vomiting.  Endocrine: Negative for cold intolerance, heat intolerance and polydipsia.  Genitourinary: Negative for decreased urine volume, dysuria and hematuria.  Musculoskeletal: Positive for back pain. Negative for arthralgias and gait problem.  Skin: Negative for rash.   Allergic/Immunologic: Negative for environmental allergies.  Neurological: Negative for seizures, syncope, light-headedness and headaches.  Hematological: Negative for adenopathy.  Psychiatric/Behavioral: Negative for agitation, dysphoric mood and suicidal ideas. The patient is not nervous/anxious.     Per HPI unless specifically indicated above     Objective:    BP 120/76 (BP Location: Left Arm, Patient Position: Sitting, Cuff Size: Normal)   Pulse 71   Temp 97.9 F (36.6 C) (Other (Comment))   Ht 5' 6.5" (1.689 m)   Wt 250 lb (113.4 kg)   SpO2 97%   BMI 39.75 kg/m   Wt Readings from Last 3 Encounters:  07/17/16 250 lb (113.4 kg)  07/12/15 253 lb (114.8 kg)  02/03/14 242 lb 0.7 oz (109.8 kg)    Physical Exam  Constitutional: He is oriented to person, place, and time. He appears well-developed and well-nourished.  HENT:  Head: Normocephalic and atraumatic.  Neck: Neck supple.  Cardiovascular: Normal rate and regular rhythm.   Pulmonary/Chest: Effort normal and breath sounds normal. He has no wheezes.  Abdominal: Soft. Bowel sounds are normal. There is no hepatosplenomegaly. There is no tenderness.  Musculoskeletal: He exhibits no edema.  Lymphadenopathy:    He has no cervical adenopathy.  Neurological: He is alert and oriented to person, place, and time.  Skin: Skin is warm and dry.  Psychiatric: He has a normal mood and affect. His behavior is normal.  Vitals reviewed.   Results for orders placed or performed in visit on 07/03/16  Lipid Profile  Result Value Ref Range   Cholesterol 156 <200  mg/dL   Triglycerides 98 <161<150 mg/dL   HDL 40 (L) >09>40 mg/dL   Total CHOL/HDL Ratio 3.9 <5.0 Ratio   VLDL 20 <30 mg/dL   LDL Cholesterol 96 <604<100 mg/dL  COMPLETE METABOLIC PANEL WITH GFR  Result Value Ref Range   Sodium 141 135 - 146 mmol/L   Potassium 4.4 3.5 - 5.3 mmol/L   Chloride 106 98 - 110 mmol/L   CO2 25 20 - 31 mmol/L   Glucose, Bld 89 65 - 99 mg/dL   BUN 14 7 -  25 mg/dL   Creat 5.401.01 9.810.60 - 1.911.35 mg/dL   Total Bilirubin 0.8 0.2 - 1.2 mg/dL   Alkaline Phosphatase 66 40 - 115 U/L   AST 24 10 - 40 U/L   ALT 20 9 - 46 U/L   Total Protein 6.9 6.1 - 8.1 g/dL   Albumin 4.2 3.6 - 5.1 g/dL   Calcium 9.4 8.6 - 47.810.3 mg/dL   GFR, Est African American >89 >=60 mL/min   GFR, Est Non African American >89 >=60 mL/min  HgB A1c  Result Value Ref Range   Hgb A1c MFr Bld 5.6 <5.7 %   Mean Plasma Glucose 114 mg/dL      Assessment & Plan:   Encounter Diagnoses  Name Primary?  . Class 2 obesity with body mass index (BMI) of 39.0 to 39.9 in adult, unspecified obesity type, unspecified whether serious comorbidity present Yes  . Well adult exam   . Onychomycosis      -reviewed labs with pt -encouraged pt to continue healthy lifestyle including proper nutrition and regular exercise. -rx terbinafine for toenail fungus.  Explained to pt rx x 3 months.  Will need to check LFTs after one month. Pt states understanding.  Handout given. -follow up one year.  RTO sooner prn

## 2016-07-17 NOTE — Patient Instructions (Signed)
Infeccin por hongos en las uas (Fungal Nail Infection) La infeccin por hongos en las uas es una infeccin por hongos frecuente en las uas de los pies o de las manos. Este trastorno afecta las uas de los pies con ms frecuencia que las uas de las manos. Ms de una ua puede infectarse. Esta afeccin puede transmitirse de una persona a otra (es  contagiosa). CAUSAS La causa de esta afeccin es un hongo. Existen distintos tipos de hongos que pueden causar la infeccin. Estos hongos son ms frecuentes en las zonas hmedas y clidas. Si las manos o los pies entran en contacto con los hongos, se pueden introducir en una ruptura de las uas de las manos o de los pies y producir la infeccin. FACTORES DE RIESGO Los siguientes factores pueden hacer que usted sea propenso a sufrir esta afeccin:  Ser varn.  Tener diabetes.  Ser una persona de edad avanzada.  Convivir con alguien que tiene hongos.  Caminar descalzo en zonas donde proliferan hongos, como duchas o vestuarios.  Tener mala circulacin.  Usar zapatos y calcetines que hacen transpirar los pies.  Tener pie de atleta.  Tener una ua lastimada o antecedentes recientes de una ciruga de uas.  Tener psoriasis.  Debilitamiento del sistema de defensa del cuerpo (sistema inmunitario). SNTOMAS Los sntomas de esta afeccin incluyen lo siguiente:  Una mancha plida sobre la ua.  Engrosamiento de la ua.  Una ua que se torna amarilla o marrn.  Bordes de las uas rugosos o quebradizos.  Una ua que se cae.  Una ua que se ha desprendido del lecho ungueal. DIAGNSTICO Esta afeccin se diagnostica mediante un examen fsico. El mdico podr tomar una muestra de la ua para examinarla y detectar si tiene hongos. TRATAMIENTO Las infecciones leves no necesitan tratamiento. Si tiene cambios importantes en las uas, el tratamiento puede incluir lo siguiente:  Medicamentos antimicticos por va oral. Deber tomar los  medicamentos durante algunas semanas o meses y no ver los resultados hasta despus de un largo tiempo. Estos medicamentos pueden tener efectos secundarios. Consulte al mdico sobre los problemas a los que debe estar atento.  Cremas y esmaltes para uas antimicticos. Se pueden usar junto con los medicamentos antimicticos que se administran por va oral.  Tratamiento lser de las uas.  Ciruga para extirpar la ua. Esto puede ser necesario en los casos ms graves de infecciones. El tratamiento es muy largo y la infeccin puede reaparecer. INSTRUCCIONES PARA EL CUIDADO EN EL HOGAR Medicamentos  Tome o aplquese los medicamentos de venta libre y recetados solamente como se lo haya indicado el mdico.  Consulte al mdico sobre el uso de pomadas mentoladas para las uas de venta libre. Estilo de vida  No comparta elementos personales como toallas o cortauas.  Crtese las uas con frecuencia.  Lvese y squese las manos y los pies todos los das.  Use calcetines absorbentes y cmbiese los calcetines con frecuencia.  Use un tipo de calzado que permita que el aire circule, como sandalias o zapatillas de lona. Deseche los zapatos viejos.  Use guantes de goma si est trabajando con sus manos en lugares mojados.  No camine descalzo en duchas o vestuarios.  No concurra a un saln de cosmtica de uas si no usan instrumentos limpios.  No use uas artificiales. Instrucciones generales  Concurra a todas las visitas de control como se lo haya indicado el mdico. Esto es importante.  Aplquese polvo antimictico en los pies y en los zapatos. SOLICITE ATENCIN   MDICA SI: La infeccin no mejora o si empeora despus de varios meses. Esta informacin no tiene como fin reemplazar el consejo del mdico. Asegrese de hacerle al mdico cualquier pregunta que tenga. Document Released: 03/20/2005 Document Revised: 10/02/2015 Document Reviewed: 12/12/2014 Elsevier Interactive Patient Education   2017 [employer]  

## 2016-08-13 ENCOUNTER — Other Ambulatory Visit: Payer: Self-pay | Admitting: Student

## 2016-08-13 DIAGNOSIS — B351 Tinea unguium: Secondary | ICD-10-CM

## 2016-08-13 DIAGNOSIS — Z6839 Body mass index (BMI) 39.0-39.9, adult: Principal | ICD-10-CM

## 2016-08-13 DIAGNOSIS — E669 Obesity, unspecified: Secondary | ICD-10-CM

## 2016-08-17 LAB — COMPREHENSIVE METABOLIC PANEL
ALK PHOS: 72 U/L (ref 40–115)
ALT: 21 U/L (ref 9–46)
AST: 24 U/L (ref 10–40)
Albumin: 4.6 g/dL (ref 3.6–5.1)
BILIRUBIN TOTAL: 0.9 mg/dL (ref 0.2–1.2)
BUN: 16 mg/dL (ref 7–25)
CALCIUM: 9.6 mg/dL (ref 8.6–10.3)
CO2: 24 mmol/L (ref 20–31)
CREATININE: 0.92 mg/dL (ref 0.60–1.35)
Chloride: 106 mmol/L (ref 98–110)
GLUCOSE: 79 mg/dL (ref 65–99)
Potassium: 4.4 mmol/L (ref 3.5–5.3)
Sodium: 139 mmol/L (ref 135–146)
Total Protein: 7.2 g/dL (ref 6.1–8.1)

## 2016-08-17 LAB — HEPATIC FUNCTION PANEL
ALBUMIN: 4.6 g/dL (ref 3.6–5.1)
ALT: 21 U/L (ref 9–46)
AST: 26 U/L (ref 10–40)
Alkaline Phosphatase: 68 U/L (ref 40–115)
BILIRUBIN INDIRECT: 0.7 mg/dL (ref 0.2–1.2)
Bilirubin, Direct: 0.2 mg/dL (ref <=0.2)
TOTAL PROTEIN: 7.2 g/dL (ref 6.1–8.1)
Total Bilirubin: 0.9 mg/dL (ref 0.2–1.2)

## 2016-08-17 LAB — LIPID PANEL
Cholesterol: 202 mg/dL — ABNORMAL HIGH (ref <200)
HDL: 45 mg/dL (ref >40)
LDL Cholesterol: 131 mg/dL — ABNORMAL HIGH (ref <100)
Total CHOL/HDL Ratio: 4.5 Ratio (ref <5.0)
Triglycerides: 128 mg/dL (ref <150)
VLDL: 26 mg/dL (ref <30)

## 2016-08-17 LAB — HEMOGLOBIN A1C
HEMOGLOBIN A1C: 5.5 % (ref <5.7)
MEAN PLASMA GLUCOSE: 111 mg/dL

## 2016-08-19 ENCOUNTER — Other Ambulatory Visit: Payer: Self-pay | Admitting: Physician Assistant

## 2016-09-19 ENCOUNTER — Other Ambulatory Visit: Payer: Self-pay | Admitting: Physician Assistant

## 2016-12-26 ENCOUNTER — Other Ambulatory Visit: Payer: Self-pay | Admitting: Physician Assistant

## 2016-12-26 DIAGNOSIS — E785 Hyperlipidemia, unspecified: Secondary | ICD-10-CM

## 2017-07-07 ENCOUNTER — Other Ambulatory Visit (HOSPITAL_COMMUNITY)
Admission: RE | Admit: 2017-07-07 | Discharge: 2017-07-07 | Disposition: A | Discharge status: Home / Self Care | Payer: Self-pay | Source: Ambulatory Visit | Attending: Physician Assistant | Admitting: Physician Assistant

## 2017-07-07 DIAGNOSIS — E785 Hyperlipidemia, unspecified: Secondary | ICD-10-CM | POA: Insufficient documentation

## 2017-07-07 LAB — LIPID PANEL
Cholesterol: 181 mg/dL (ref 0–200)
HDL: 42 mg/dL (ref >40)
LDL CALC: 117 mg/dL — AB (ref 0–99)
TRIGLYCERIDES: 112 mg/dL (ref <150)
Total CHOL/HDL Ratio: 4.3 RATIO
VLDL: 22 mg/dL (ref 0–40)

## 2017-07-17 ENCOUNTER — Ambulatory Visit: Payer: Self-pay | Admitting: Physician Assistant

## 2017-07-17 ENCOUNTER — Encounter: Payer: Self-pay | Admitting: Physician Assistant

## 2017-07-17 VITALS — BP 130/81 | HR 73 | Temp 97.7°F | Ht 66.5 in | Wt 251.5 lb

## 2017-07-17 DIAGNOSIS — E785 Hyperlipidemia, unspecified: Secondary | ICD-10-CM

## 2017-07-17 DIAGNOSIS — E669 Obesity, unspecified: Secondary | ICD-10-CM

## 2017-07-17 DIAGNOSIS — Z6839 Body mass index (BMI) 39.0-39.9, adult: Secondary | ICD-10-CM

## 2017-07-17 DIAGNOSIS — Z0000 Encounter for general adult medical examination without abnormal findings: Secondary | ICD-10-CM

## 2017-07-17 NOTE — Progress Notes (Signed)
BP 130/81 (BP Location: Left Arm, Patient Position: Sitting, Cuff Size: Normal)   Pulse 73   Temp 97.7 F (36.5 C)   Ht 5' 6.5" (1.689 m)   Wt 251 lb 8 oz (114.1 kg)   SpO2 98%   BMI 39.99 kg/m    Subjective:    Patient ID: Christopher Compton, male    DOB: August 22, 1975, 42 y.o.   MRN: [id number]  HPI: Christopher Compton is a 42 y.o. male presenting on 07/17/2017 for Follow-up   HPI   Pt is doing well today and has no complaints  Relevant past medical, surgical, family and social history reviewed and updated as indicated. Interim medical history since our last visit reviewed. Allergies and medications reviewed and updated.  No current outpatient medications on file.   Review of Systems  Constitutional: Negative for appetite change, chills, diaphoresis, fatigue, fever and unexpected weight change.  HENT: Negative for congestion, dental problem, drooling, ear pain, facial swelling, hearing loss, mouth sores, sneezing, sore throat, trouble swallowing and voice change.   Eyes: Negative for pain, discharge, redness, itching and visual disturbance.  Respiratory: Negative for cough, choking, shortness of breath and wheezing.   Cardiovascular: Negative for chest pain, palpitations and leg swelling.  Gastrointestinal: Negative for abdominal pain, blood in stool, constipation, diarrhea and vomiting.  Endocrine: Negative for cold intolerance, heat intolerance and polydipsia.  Genitourinary: Negative for decreased urine volume, dysuria and hematuria.  Musculoskeletal: Negative for arthralgias, back pain and gait problem.  Skin: Negative for rash.  Allergic/Immunologic: Negative for environmental allergies.  Neurological: Negative for seizures, syncope, light-headedness and headaches.  Hematological: Negative for adenopathy.  Psychiatric/Behavioral: Negative for agitation, dysphoric mood and suicidal ideas. The patient is not nervous/anxious.     Per HPI unless specifically indicated  above     Objective:    BP 130/81 (BP Location: Left Arm, Patient Position: Sitting, Cuff Size: Normal)   Pulse 73   Temp 97.7 F (36.5 C)   Ht 5' 6.5" (1.689 m)   Wt 251 lb 8 oz (114.1 kg)   SpO2 98%   BMI 39.99 kg/m   Wt Readings from Last 3 Encounters:  07/17/17 251 lb 8 oz (114.1 kg)  07/17/16 250 lb (113.4 kg)  07/12/15 253 lb (114.8 kg)    Physical Exam  Constitutional: He is oriented to person, place, and time. He appears well-developed and well-nourished.  HENT:  Head: Normocephalic and atraumatic.  Neck: Neck supple.  Cardiovascular: Normal rate and regular rhythm.  Pulmonary/Chest: Effort normal and breath sounds normal. He has no wheezes.  Abdominal: Soft. Bowel sounds are normal. There is no hepatosplenomegaly. There is no tenderness.  Musculoskeletal: He exhibits no edema.  Lymphadenopathy:    He has no cervical adenopathy.  Neurological: He is alert and oriented to person, place, and time.  Skin: Skin is warm and dry.  Psychiatric: He has a normal mood and affect. His behavior is normal.  Vitals reviewed.   Results for orders placed or performed during the hospital encounter of 07/07/17  Lipid panel  Result Value Ref Range   Cholesterol 181 0 - 200 mg/dL   Triglycerides 782 <956 mg/dL   HDL 42 >21 mg/dL   Total CHOL/HDL Ratio 4.3 RATIO   VLDL 22 0 - 40 mg/dL   LDL Cholesterol 308 (H) 0 - 99 mg/dL      Assessment & Plan:   Encounter Diagnoses  Name Primary?  . Well adult exam Yes  . Class 2 obesity with  body mass index (BMI) of 39.0 to 39.9 in adult, unspecified obesity type, unspecified whether serious comorbidity present   . Hyperlipidemia, unspecified hyperlipidemia type      -reviewed labs with pt -counseled pt to maintain low fat diet -encouraged pt to exercise regularly -counseled on weight management -pt to follow up 1 year.  RTO sooner prn

## 2018-07-10 ENCOUNTER — Other Ambulatory Visit (HOSPITAL_COMMUNITY)
Admission: RE | Admit: 2018-07-10 | Discharge: 2018-07-10 | Disposition: A | Discharge status: Home / Self Care | Payer: Self-pay | Source: Ambulatory Visit | Attending: Physician Assistant | Admitting: Physician Assistant

## 2018-07-10 DIAGNOSIS — E785 Hyperlipidemia, unspecified: Secondary | ICD-10-CM | POA: Insufficient documentation

## 2018-07-10 LAB — LIPID PANEL
CHOL/HDL RATIO: 4.1 ratio
CHOLESTEROL: 168 mg/dL (ref 0–200)
HDL: 41 mg/dL (ref >40)
LDL Cholesterol: 108 mg/dL — ABNORMAL HIGH (ref 0–99)
Triglycerides: 95 mg/dL (ref <150)
VLDL: 19 mg/dL (ref 0–40)

## 2018-07-16 ENCOUNTER — Encounter: Payer: Self-pay | Admitting: Physician Assistant

## 2018-07-16 ENCOUNTER — Ambulatory Visit: Payer: Self-pay | Admitting: Physician Assistant

## 2018-07-16 VITALS — BP 118/71 | HR 62 | Temp 97.7°F | Ht 66.5 in | Wt 264.0 lb

## 2018-07-16 DIAGNOSIS — Z0000 Encounter for general adult medical examination without abnormal findings: Secondary | ICD-10-CM

## 2018-07-16 NOTE — Progress Notes (Signed)
BP 118/71 (BP Location: Right Arm, Patient Position: Sitting, Cuff Size: Normal)   Pulse 62   Temp 97.7 F (36.5 C)   Ht 5' 6.5" (1.689 m)   Wt 264 lb (119.7 kg)   SpO2 96%   BMI 41.97 kg/m    Subjective:    Patient ID: Christopher Compton, male    DOB: 1975/07/09, 43 y.o.   MRN: [id number]  HPI: Christopher Compton is a 43 y.o. male presenting on 07/16/2018 for Follow-up   HPI   Pt is here for annual well check  He is doing well and has no complaints  Relevant past medical, surgical, family and social history reviewed and updated as indicated. Interim medical history since our last visit reviewed. Allergies and medications reviewed and updated.  No current outpatient medications on file.   Review of Systems  Constitutional: Negative for appetite change, chills, diaphoresis, fatigue, fever and unexpected weight change.  HENT: Negative for congestion, dental problem, drooling, ear pain, facial swelling, hearing loss, mouth sores, sneezing, sore throat, trouble swallowing and voice change.   Eyes: Negative for pain, discharge, redness, itching and visual disturbance.  Respiratory: Negative for cough, choking, shortness of breath and wheezing.   Cardiovascular: Negative for chest pain, palpitations and leg swelling.  Gastrointestinal: Negative for abdominal pain, blood in stool, constipation, diarrhea and vomiting.  Endocrine: Negative for cold intolerance, heat intolerance and polydipsia.  Genitourinary: Negative for decreased urine volume, dysuria and hematuria.  Musculoskeletal: Negative for arthralgias, back pain and gait problem.  Skin: Negative for rash.  Allergic/Immunologic: Negative for environmental allergies.  Neurological: Negative for seizures, syncope, light-headedness and headaches.  Hematological: Negative for adenopathy.  Psychiatric/Behavioral: Negative for agitation, dysphoric mood and suicidal ideas. The patient is not nervous/anxious.     Per HPI  unless specifically indicated above     Objective:    BP 118/71 (BP Location: Right Arm, Patient Position: Sitting, Cuff Size: Normal)   Pulse 62   Temp 97.7 F (36.5 C)   Ht 5' 6.5" (1.689 m)   Wt 264 lb (119.7 kg)   SpO2 96%   BMI 41.97 kg/m   Wt Readings from Last 3 Encounters:  07/16/18 264 lb (119.7 kg)  07/17/17 251 lb 8 oz (114.1 kg)  07/17/16 250 lb (113.4 kg)    Physical Exam Vitals signs reviewed.  Constitutional:      Appearance: He is well-developed.  HENT:     Head: Normocephalic and atraumatic.     Mouth/Throat:     Pharynx: No oropharyngeal exudate.  Eyes:     Conjunctiva/sclera: Conjunctivae normal.     Pupils: Pupils are equal, round, and reactive to light.  Neck:     Musculoskeletal: Neck supple.     Thyroid: No thyromegaly.  Cardiovascular:     Rate and Rhythm: Normal rate and regular rhythm.  Pulmonary:     Effort: Pulmonary effort is normal.     Breath sounds: Normal breath sounds. No wheezing or rales.  Abdominal:     General: Bowel sounds are normal.     Palpations: Abdomen is soft. There is no mass.     Tenderness: There is no abdominal tenderness.  Lymphadenopathy:     Cervical: No cervical adenopathy.  Skin:    General: Skin is warm and dry.     Findings: No rash.  Neurological:     Mental Status: He is alert and oriented to person, place, and time.  Psychiatric:        Behavior:  Behavior normal.        Thought Content: Thought content normal.     Results for orders placed or performed during the hospital encounter of 07/10/18  Lipid panel  Result Value Ref Range   Cholesterol 168 0 - 200 mg/dL   Triglycerides 95 <335 mg/dL   HDL 41 >45 mg/dL   Total CHOL/HDL Ratio 4.1 RATIO   VLDL 19 0 - 40 mg/dL   LDL Cholesterol 625 (H) 0 - 99 mg/dL      Assessment & Plan:   Encounter Diagnoses  Name Primary?  . Well adult exam Yes  . Morbid obesity (HCC)     -reviewed labs with pt -counesled pt on healthy lifestyle.  Counseled on  healthy diet and regular exercise to prevent weight gain (he gained 12 pounds in the past year) -Pt requests to follow up when it's warmer outside.  Will see him back in October.  Pt to RTO sooner prn

## 2018-07-16 NOTE — Patient Instructions (Signed)
Prevenir el aumento de peso no saludable, en adultos  Preventing Unhealthy Weight Gain, Adult  Mantener un peso saludable es importante para su salud general. Cuando la grasa se acumula en el cuerpo, usted puede convertirse en obeso o tener sobrepeso. Tener sobrepeso o ser obeso, aumenta el riesgo de desarrollar ciertos problemas de salud, como enfermedades cardacas, diabetes, problemas para dormir, problemas articulares y algunos tipos de cncer.  El aumento de peso no saludable suele ser el resultado de realizar elecciones de alimentos poco saludables o de no hacer suficiente ejercicio fsico. Puede hacer cambios en su estilo de vida para prevenir la obesidad y mantenerse tan saludable como sea posible.  Qu cambios en la alimentacin se pueden realizar?     Coma solo la cantidad que el cuerpo necesita. Haga lo siguiente:  ? Preste atencin a los signos de que tiene hambre o que se siente satisfecho. Deje de comer tan pronto como se sienta satisfecho.  ? Si tiene hambre, intente primero beber agua antes de comer. Beba suficiente agua para que la orina sea clara o de color amarillo plido.  ? Coma porciones ms pequeas. Preste atencin a los tamaos de las porciones cuando coma afuera.  ? Consulte los tamaos de las porciones en las etiquetas de los alimentos. La mayora de los alimentos contiene ms de una porcin por envase.  ? Consuma la cantidad recomendada de caloras segn su sexo y nivel de actividad. Para la mayora de las personas activas, un total diario de 2,000 de caloras es adecuado. Si est tratando de perder peso o si no es muy activo, tal vez deba comer menos caloras. Hable con su mdico o un especialista en alimentacin y nutricin (nutricionista) sobre cuntas caloras necesita consumir por da.   Elija alimentos saludables, por ejemplo:  ? Frutas y verduras. En cada comida, trate de que al menos la mitad del plato sea de frutas y verduras.  ? Cereales integrales, como el pan integral,  el arroz integral y la quinua.  ? Carnes magras, como la carne de ave o pescado.  ? Otras protenas saludables, como los frijoles, los huevos o el tofu.  ? Grasas saludables, como las nueces, las semillas, los pescados grasos y el aceite de oliva.  ? Productos lcteos descremados o con bajo contenido de grasa.   Revise las etiquetas de los alimentos y evite los alimentos y bebidas que:  ? Contienen muchas caloras.  ? Tienen agregados de azcar.  ? Contienen mucho sodio.  ? Tienen grasas saturadas o grasas trans.   Cocine los alimentos en formas ms saludables, tales como hornear, emparrillar o asar a la parrilla.   Disee un plan de comidas para la semana y haga las compras con una lista de comestibles para ayudarlo mantenerse encaminado en sus compras. Trate de evitar ir a la tienda de comestibles cuando tenga apetito.   Cuando vaya a hacer las compras, intente recorrer la parte de afuera de la tienda primero, donde estn los alimentos frescos. Hacer esto le ayuda a evitar los alimentos preenvasados, los que pueden tener un alto contenido de azcar, sal (sodio), y grasa.  Qu cambios en el estilo de vida se pueden realizar?     Haga 30minutos de ejercicio o ms, 5das por semana o ms. Hacer ejercicio puede incluir caminar rpido, trabajar en el jardn, andar en bicicleta, correr, nadar y practicar deportes en equipo como baloncesto y ftbol. Consulte al mdico qu ejercicios son seguros para usted.   Realice actividades de   fortalecimiento muscular, tales como levantar pesas o usar bandas de resistencia, 2 o ms das por semana.   No consuma ningn producto que contenga nicotina o tabaco, como cigarrillos y cigarrillos electrnicos. Si necesita ayuda para dejar de fumar, consulte al mdico.   Limite el consumo de alcohol a no ms de 1medida por da si es mujer y no est embarazada, y a 2medidas por da si es hombre. Una medida equivale a 12oz (355ml) de cerveza, 5oz (148ml) de vino o 1oz (44ml)  de bebidas alcohlicas de alta graduacin.   Trate de dormir entre 7 y 9horas todas las noches.  Qu otros cambios se pueden realizar?   Lleve un registro de los alimentos que come y las actividades que realiza para controlar:  ? Lo que comi y cuntas caloras tena. Recuerde contar las caloras en las salsas, los aderezos y las guarniciones.  ? Si estuvo activo y qu ejercicios hizo.  ? Sus objetivos de caloras, peso y actividad.   Controle su peso regularmente. Haga un seguimiento de los cambios. Si observa que ha subido de peso, haga cambios en su dieta o rutina de actividad.   Evite tomar medicamentos o suplementos para perder peso. Hable con el mdico antes de empezar cualquier medicamento o suplemento nuevo.   Hable con el mdico antes de probar una dieta nueva o un plan nuevo de actividad fsica.  Por qu son importantes estos cambios?  Comer sano, mantenerse activo y tener hbitos saludables puede ayudarlo a prevenir la obesidad. Estos cambios tambin:   Le ayudan a controlar el estrs y las emociones.   Le ayudan a sentirse conectado con amigos y familiares.   Mejoran su autoestima.   Mejoran el sueo.   Previenen problemas de salud a largo plazo.  Qu puede suceder si no se hacen cambios?  Ser obeso o tener sobrepeso puede hacer que desarrolle problemas en las articulaciones o en los huesos, lo que puede dificultarle mantenerse activo o realizar actividades que disfruta. Ser obeso o tener sobrepeso hace que el corazn y los pulmones se esfuercen ms para trabajar, y puede provocar problemas de salud, como diabetes, enfermedad cardaca y ciertos tipos de cnceres.  Dnde encontrar ms informacin:  Hable con su mdico o un nutricionista sobre opciones de alimentacin saludable y estilo de vida saludable. Tambin puede encontrar informacin en:   MyPlate del Departamento de Agricultura de los [place]:www.choosemyplate.gov   Asociacin Estadounidense del [place] (American Heart  Association): www.heart.org   Centers for Disease Control and Prevention (Centros para el control y la prevencin de enfermedades, CDC): www.cdc.gov  Resumen   Mantener un peso saludable es importante para su salud general. Le ayuda a prevenir ciertas enfermedades y problemas de salud, como enfermedad cardaca, diabetes, trastornos del sueo, problemas articulares y ciertos tipos de cncer.   Ser obeso o tener sobrepeso puede hacer que desarrolle problemas en las articulaciones o en los huesos, lo que puede dificultarle mantenerse activo o realizar actividades que disfruta.   Puede prevenir el aumento de peso no saludable al llevar una dieta saludable, hacer ejercicio regularmente, no fumar, limitar el consumo de alcohol y dormir lo suficiente.   Hable con su mdico o un nutricionista para obtener asesoramiento sobre opciones de alimentacin saludable y estilo de vida saludable.  Esta informacin no tiene como fin reemplazar el consejo del mdico. Asegrese de hacerle al mdico cualquier pregunta que tenga.  Document Released: 10/18/2016 Document Revised: 06/18/2017 Document Reviewed: 10/18/2016  Elsevier Interactive Patient Education  2019   Elsevier Inc.

## 2019-01-30 ENCOUNTER — Other Ambulatory Visit: Payer: Self-pay | Admitting: Radiology

## 2019-01-30 DIAGNOSIS — Z20822 Contact with and (suspected) exposure to covid-19: Secondary | ICD-10-CM

## 2019-01-31 LAB — NOVEL CORONAVIRUS, NAA: SARS-CoV-2, NAA: NOT DETECTED

## 2019-04-13 ENCOUNTER — Other Ambulatory Visit (HOSPITAL_COMMUNITY)
Admission: RE | Admit: 2019-04-13 | Discharge: 2019-04-13 | Disposition: A | Discharge status: Home / Self Care | Payer: Self-pay | Source: Ambulatory Visit | Attending: Physician Assistant | Admitting: Physician Assistant

## 2019-04-13 DIAGNOSIS — R69 Illness, unspecified: Secondary | ICD-10-CM | POA: Insufficient documentation

## 2019-04-15 ENCOUNTER — Other Ambulatory Visit (HOSPITAL_COMMUNITY)
Admission: RE | Admit: 2019-04-15 | Discharge: 2019-04-15 | Disposition: A | Discharge status: Home / Self Care | Payer: Self-pay | Source: Ambulatory Visit | Attending: Physician Assistant | Admitting: Physician Assistant

## 2019-04-15 ENCOUNTER — Encounter: Payer: Self-pay | Admitting: Physician Assistant

## 2019-04-15 ENCOUNTER — Other Ambulatory Visit: Payer: Self-pay

## 2019-04-15 ENCOUNTER — Ambulatory Visit: Payer: Self-pay | Admitting: Physician Assistant

## 2019-04-15 VITALS — BP 120/78 | HR 71 | Temp 97.7°F | Ht 66.5 in | Wt 263.0 lb

## 2019-04-15 DIAGNOSIS — B351 Tinea unguium: Secondary | ICD-10-CM | POA: Insufficient documentation

## 2019-04-15 DIAGNOSIS — Z0000 Encounter for general adult medical examination without abnormal findings: Secondary | ICD-10-CM

## 2019-04-15 LAB — HEPATIC FUNCTION PANEL
ALT: 37 U/L (ref 0–44)
AST: 33 U/L (ref 15–41)
Albumin: 4.4 g/dL (ref 3.5–5.0)
Alkaline Phosphatase: 80 U/L (ref 38–126)
Bilirubin, Direct: 0.1 mg/dL (ref 0.0–0.2)
Indirect Bilirubin: 0.7 mg/dL (ref 0.3–0.9)
Total Bilirubin: 0.8 mg/dL (ref 0.3–1.2)
Total Protein: 7.2 g/dL (ref 6.5–8.1)

## 2019-04-15 NOTE — Progress Notes (Signed)
BP 120/78   Pulse 71   Temp 97.7 F (36.5 C)   Ht 5' 6.5" (1.689 m)   Wt 263 lb (119.3 kg)   SpO2 97%   BMI 41.81 kg/m    Subjective:    Patient ID: Christopher Compton, male    DOB: 1975-11-28, 43 y.o.   MRN: [id number]  HPI: Christopher Compton is a 43 y.o. male presenting on 04/15/2019 for No chief complaint on file.   HPI    Pt had a negative covid 19 screening questionnaire   Pt is here today for annual well visit.  It has been a bit less than a year because pt wanted to be seen in the warmer months (previously seen in winter)  Pt is working.    Pt is trying to exercise regularly  He has no complaints except that he wants to try tratment for toenail fungus again.  He was on that in the past (early 2018) and says it helped but didn't completely resolve.  He says it is uncomfortable when he wears steel told boots at work.      Relevant past medical, surgical, family and social history reviewed and updated as indicated. Interim medical history since our last visit reviewed. Allergies and medications reviewed and updated.  No current outpatient medications on file.    Review of Systems  Per HPI unless specifically indicated above     Objective:    BP 120/78   Pulse 71   Temp 97.7 F (36.5 C)   Ht 5' 6.5" (1.689 m)   Wt 263 lb (119.3 kg)   SpO2 97%   BMI 41.81 kg/m   Wt Readings from Last 3 Encounters:  04/15/19 263 lb (119.3 kg)  07/16/18 264 lb (119.7 kg)  07/17/17 251 lb 8 oz (114.1 kg)    Physical Exam Vitals signs reviewed.  Constitutional:      General: He is not in acute distress.    Appearance: Normal appearance. He is well-developed. He is obese. He is not ill-appearing.  HENT:     Head: Normocephalic and atraumatic.  Neck:     Musculoskeletal: Neck supple.  Cardiovascular:     Rate and Rhythm: Normal rate and regular rhythm.     Pulses:          Dorsalis pedis pulses are 2+ on the right side and 2+ on the left side.  Pulmonary:      Effort: Pulmonary effort is normal.     Breath sounds: Normal breath sounds. No wheezing.  Abdominal:     General: Bowel sounds are normal.     Palpations: Abdomen is soft.     Tenderness: There is no abdominal tenderness.  Musculoskeletal:     Left hand: He exhibits deformity.     Right lower leg: No edema.     Left lower leg: No edema.     Comments: L 5th finger deformity (old fracture)  Lymphadenopathy:     Cervical: No cervical adenopathy.  Skin:    General: Skin is warm and dry.     Comments: Toenails thickened, L>R  Neurological:     Mental Status: He is alert and oriented to person, place, and time.     Cranial Nerves: No facial asymmetry.     Sensory: Sensation is intact.     Motor: No weakness or tremor.     Gait: Gait is intact.     Deep Tendon Reflexes:     Reflex Scores:  Patellar reflexes are 2+ on the right side and 2+ on the left side. Psychiatric:        Attention and Perception: Attention normal.        Mood and Affect: Mood normal.        Speech: Speech normal.        Behavior: Behavior normal. Behavior is cooperative.           Assessment & Plan:    Encounter Diagnoses  Name Primary?  . Well adult exam Yes  . Morbid obesity (HCC)   . Onychomycosis      -counseled pt on diet and exercise to reduce risks of future health problems.   Gave reading information  -will check liver function then rx terbinafine ([facility]) for toenail fungus  -pt already got flu shot this season  -pt to follow up  1 year

## 2019-04-15 NOTE — Patient Instructions (Signed)
Prevencin de riesgos para la salud debido al sobrepeso Preventing Health Risks of Being Overweight Mantener un peso corporal saludable es una parte importante de su salud general. Un peso corporal saludable depende de la edad, el sexo y Agricultural consultant. Tener sobrepeso lo pone en riesgo de sufrir muchos problemas de salud, incluidos los siguientes:  Enfermedad cardaca.  Diabetes.  Problemas para dormir.  Problemas en las articulaciones. Puede realizar [employer] dieta y el estilo de vida para evitar estos riesgos. Considere trabajar con un mdico o un nutricionista para Marketing executive. Qu cambios en la alimentacin se pueden realizar?   Coma solo la cantidad que el cuerpo necesita. En la [employer], esta cantidad es de aproximadamente 2.000 caloras por da, pero la cantidad vara segn la altura, el sexo y [place]. Pregntele al mdico cuntas caloras deber ingerir por da. Comer ms de lo que el cuerpo necesita regularmente puede llevarlo a tener sobrepeso u obesidad.  Coma lentamente y deje de comer cuando se sienta satisfecho.  Elija alimentos saludables, incluidos los siguientes: ? [place] y verduras. ? [employer]. ? Productos lcteos descremados. ? Alimentos ricos en fibra, como cereales integrales y frijoles. ? Refrigerios saludables, como bastones de verdura, un trozo de fruta o una cantidad pequea de yogur o [place].  Evite los alimentos y las bebidas con alto contenido de Location manager, sal (sodio), grasas saturadas o grasas trans. Esto incluye lo siguiente: ? Muchos postres, como caramelos, galletas y helado. ? Gaseosas. ? Comidas fritas. ? Carnes procesadas, como hot dogs o fiambres. ? Bocadillos envasados. Qu cambios en el estilo de vida se pueden realizar?   Haga ejercicio durante por lo menos 150 minutos a la semana para evitar el aumento de [place], o con la frecuencia recomendada por su mdico. Haga ejercicios de intensidad moderada, como  caminar a paso ligero. ? Distribyalo al hacer ejercicio durante 30 minutos 5 das a la [place], o rfagas cortas de 10 minutos varias veces al da.  Encuentre otras formas de mantenerse activo y Public librarian caloras, como trabajar en el jardn o un pasatiempo que involucre de [place] fsica.  Duerma como mnimo 8horas todas las noches. Al estar bien descansado, tendr ms probabilidades de estar activo fsicamente y Field seismologist elecciones saludables [place]. Para dormir mejor: ? Trate de ir a dormir y Clinical cytogeneticist a Dentist todos los [place]. ? Mantenga el dormitorio oscuro, silencioso y Company secretary. ? Asegrese de que la cama sea cmoda. ? Evite las actividades estimulantes, como ver televisin o Engineer, site, durante por lo menos una hora antes de la hora de dormir. Por qu son importantes estos cambios? Comer de forma saludable y [place] a perder peso y a prevenir los problemas de salud causados por el sobrepeso. Realizar estos cambios tambin puede ayudarlo a Scientific laboratory technician, sentirse mejor mentalmente y conectarse con amigos y familiares. Qu puede suceder si no se hacen cambios? Tener sobrepeso puede afectarlo durante toda la vida. Puede desarrollar problemas articulares u seos que harn que le resulte difcil o doloroso practicar deportes o realizar actividades que disfruta. Tener sobrepeso hace que el corazn y los pulmones se esfuercen ms y puede provocar problemas de salud, como diabetes, enfermedad cardaca y problemas para dormir. Dnde encontrar apoyo Puede obtener apoyo para la prevencin de los riesgos de la salud debido al sobrepeso por parte de:  Su mdico o un nutricionista. Ellos pueden orientarlo en cuanto a las opciones saludables de alimentacin y  de estilo de vida.  Grupos de apoyo para perder peso en lnea o en persona. Dnde buscar ms informacin  MyPlate: https://ball-collins.biz/ ? Esta una herramienta en lnea que proporciona recomendaciones  personalizadas sobre qu alimentos comer cada da.  Centers for Disease Control and Prevention (Centros para el control y la prevencin de enfermedades, CDC): AffordableScrapbook.gl ? Este recurso brinda consejos para Sales executive peso y Warehouse manager un estilo de vida Musician. Resumen  Para evitar el aumento de peso no saludable, es importante mantener una dieta saludable rica en verduras y cereales integrales, hacer ejercicio con regularidad y dormir al menos 8 horas todas las noches.  Realizar estos cambios ayuda a evitar muchas enfermedades a largo plazo (crnicas) que pueden acortar su vida, como la diabetes, enfermedad cardaca y accidente cerebrovascular. Esta informacin no tiene Theme park manager el consejo del mdico. Asegrese de hacerle al mdico cualquier pregunta que tenga. Document Released: 07/19/2017 Document Revised: 07/19/2017 Document Reviewed: 07/19/2017 Elsevier Patient Education  2020 [employer].

## 2019-04-16 ENCOUNTER — Other Ambulatory Visit: Payer: Self-pay | Admitting: Physician Assistant

## 2019-04-16 MED ORDER — TERBINAFINE HCL 250 MG PO TABS: ORAL_TABLET | ORAL | 0 refills | Status: DC

## 2019-10-12 ENCOUNTER — Other Ambulatory Visit: Payer: Self-pay

## 2019-10-12 ENCOUNTER — Ambulatory Visit: Payer: Self-pay | Admitting: Physician Assistant

## 2019-10-12 ENCOUNTER — Encounter: Payer: Self-pay | Admitting: Physician Assistant

## 2019-10-12 VITALS — BP 110/90 | HR 68 | Temp 98.2°F | Wt 266.5 lb

## 2019-10-12 DIAGNOSIS — K5900 Constipation, unspecified: Secondary | ICD-10-CM

## 2019-10-12 DIAGNOSIS — L0231 Cutaneous abscess of buttock: Secondary | ICD-10-CM

## 2019-10-12 MED ORDER — CEPHALEXIN 500 MG PO CAPS: 500.0000 mg | ORAL_CAPSULE | Freq: Four times a day (QID) | ORAL | 0 refills | Status: DC

## 2019-10-12 NOTE — Progress Notes (Signed)
   BP 110/90   Pulse 68   Temp 98.2 F (36.8 C)   Wt 266 lb 8 oz (120.9 kg)   SpO2 98%   BMI 42.37 kg/m    Subjective:    Patient ID: Christopher Compton, male    DOB: [birth year], 44 y.o.   MRN: [id number]  HPI: [name] is a 44 y.o. male presenting on 10/12/2019 for Mass (since last Tuesday around anus. pt states has itching, some discomfort when lying down and pain when having BM. pt states no blood in stool or wiping)   HPI  Pt had a negative covid 19 screenig qu[place]onna[place]   Bump as above.  He has had no blood PR.  He does relate some constipation.  Bump has not been draining.  He has never had this problem in the past.    Relevant past medical, surgical, family and social history reviewed and updated as indicated. Interim medical history since our last visit reviewed. Allergies and medications reviewed and updated.  Review of Systems  Per HPI unless specifically indicated above     Objective:    BP 110/90   Pulse 68   Temp 98.2 F (36.8 C)   Wt 266 lb 8 oz (120.9 kg)   SpO2 98%   BMI 42.37 kg/m   Wt Readings from Last 3 Encounters:  10/12/19 266 lb 8 oz (120.9 kg)  04/15/19 263 lb (119.3 kg)  07/16/18 264 lb (119.7 kg)    Physical Exam Constitutional:      General: He is not in acute distress.    Appearance: He is obese. He is not ill-appearing.  HENT:     Head: Normocephalic and atraumatic.  Pulmonary:     Effort: Pulmonary effort is normal. No respiratory distress.  Genitourinary:    Rectum: No external hemorrhoid.     Comments: (nurse Berenice assisted) Skin:    Comments: Firm tender area at top of gluteal cleft.  No drainage. Area is Not red.  Neurological:     Mental Status: He is alert and oriented to person, place, and time.  Psychiatric:        Attention and Perception: Attention normal.        Mood and Affect: Mood is anxious.        Speech: Speech normal.        Behavior: Behavior normal. Behavior is cooperative.            Assessment & Plan:    1. Early abscess/cellulitis  Pt to soak and/or use warm compresses to area 3 or 4 times daily for 10-20 minutes.  He is to take antibiotic until it is gone.  He is to RTO if it worsens or doesn't go away when his antibiotics are finished.   Pt is counseled to keep area dry/avoid leaving area sweaty.  2. Constipation  Pt encouraged to increase water.  Add fiber.  He can take stool softeners if needed.  Avoid holding- ie when he gets the urge to move his bowels, he should do so.

## 2019-10-12 NOTE — Patient Instructions (Addendum)
You can use over the counter cortisone cream if needed for itching.     ----------------------------------------------------      Absceso en la piel Skin Abscess  Un absceso en la piel es una zona infectada en la piel o debajo de la piel que contiene una acumulacin de pus y Psychiatric nurse. Al absceso tambin se le puede llamar fornculo, carbunclo o divieso. Un absceso puede presentarse en casi cualquier parte del cuerpo. Algunos abscesos se abren (rompen) por s solos. La mayora sigue empeorando si no se Secretary/administrator. La infeccin puede diseminarse hacia otras partes del cuerpo y Dispensing optician a la [place], lo que puede hacer que usted se sienta mal. [place], el tratamiento consiste en el drenaje del absceso. Cules son las causas? Un absceso se produce cuando los microbios, como las bacterias, pasan a travs de la piel y causan infeccin. Las causas de esto pueden ser las siguientes:  Un rasguo o un corte en la piel.  Una herida por puncin a travs de la piel, incluidas una inyeccin con aguja o la picadura de un insecto.  Obstruccin de las glndulas sebceas o sudorparas.  Obstruccin e infeccin de folculos capilares.  Un quiste que se forma debajo de la piel (quiste sebceo) y se infecta. Qu incrementa el riesgo? Es ms probable que Orthoptist en las personas que:  Tienen debilitado el sistema de defensa del organismo (sistema inmunitario).  Tienen diabetes.  Tienen la piel seca e irritada.  Reciben inyecciones con frecuencia o consumen drogas ilegales por va intravenosa.  Tienen un cuerpo extrao en una herida; por ejemplo, Tacy Dura.  Tienen problemas en el sistema [place] venas. Cules son los signos o sntomas? Los sntomas de esta afeccin incluyen:  Una protuberancia dolorosa y firme debajo de la piel.  Una protuberancia con pus en la parte superior. El pus puede romper la piel y Equities trader  exterior. Otros sntomas pueden ser los siguientes:  Enrojecimiento en torno al Environmental consultant del absceso.  Calor.  Hinchazn de los ganglios linfticos (glndulas) cerca del absceso.  Dolor a Secretary/administrator.  Una lcera en la piel. Cmo se diagnostica? Esta afeccin se puede diagnosticar en funcin de lo siguiente:  Un examen fsico.  Sus antecedentes mdicos.  Truddie Coco de pus. Esta puede utilizarse para Neurosurgeon qu est causando la infeccin.  [place] de [place].  Estudios de diagnstico por imgenes, como ecografa, exploracin por tomografa computarizada (TC) o Health visitor (RM). Cmo se trata? Un absceso pequeo que drena por s solo puede no Warden/ranger. El tratamiento de los abscesos ms grandes pueden incluir lo siguiente:  Aplicacin de una compresa de calor hmedo en la zona varias veces por da.  Un procedimiento para drenar el absceso (incisin y drenaje).  Antibiticos. Para un absceso grave, puede recibir primero antibiticos a travs de una va intravenosa y [place] a los antibiticos por boca. Siga estas indicaciones en su casa: Medicamentos   [employer] de venta libre y los recetados solamente como se lo haya indicado el mdico.  Si le recetaron un antibitico, tmelo como se lo haya indicado el mdico. No deje de tomar el antibitico aunque comience a sentirse mejor. Cuidado del absceso   Si usted tiene un absceso que no ha supurado, aplique calor en la zona afectada. Use la fuente de calor que el mdico le recomiende, como una compresa de calor hmedo o una almohadilla trmica. ? Coloque una [employer] piel y  la fuente de calor. ? Aplique el calor durante 20 a . ? Retire la fuente de calor si la piel se pone de color rojo brillante. Esto es muy importante si no puede Financial risk analyst, calor o fro. Puede correr un riesgo mayor de sufrir quemaduras.  Siga las indicaciones del mdico acerca del cuidado del  absceso. Asegrese de hacer lo siguiente: ? [place] el absceso con una venda (vendaje). ? Cambie el vendaje o la gasa como se lo haya indicado el mdico. ? Lvese las manos con agua y jabn antes de cambiar el vendaje o la gasa. Use desinfectante para manos si no dispone de [place] y [place].  Controle el [employer] para observar si hay signos de que la infeccin Archie. Est atento a los siguientes signos: ? Aumento del enrojecimiento, la hinchazn o Chief Technology Officer. ? Ms lquido [place]. ? Calor. ? Mal olor o ms pus. Indicaciones generales  Para evitar la propagacin de la infeccin: ? No comparta artculos de cuidado personal, toallas ni jacuzzis con [employer]. ? Evite el contacto piel con piel con [employer].  Concurra a todas las visitas de control como se lo haya indicado el mdico. Esto es importante. Comunquese con un mdico si tiene:  Aumento del enrojecimiento, la hinchazn o Chief Technology Officer alrededor del absceso.  Aumento de la cantidad de lquido o sangre que Manufacturing systems engineer del absceso.  La piel que rodea el absceso se siente caliente.  Ms pus o un mal olor que proviene del absceso.  Grant Ruts.  Dolores musculares.  Escalofros o sensacin general de Dentist. Solicite ayuda inmediatamente si:  Tiene dolor intenso.  Observa lneas rojas en la piel que se extienden desde el absceso. Resumen  Un absceso en la piel es una zona infectada en la piel o debajo de la piel que contiene una acumulacin de pus y Conservator, museum/gallery.  Un absceso pequeo que drena por s solo puede no Network engineer.  El tratamiento de los abscesos ms grandes puede incluir un procedimiento para drenar el absceso y la toma de un antibitico. Esta informacin no tiene Theme park manager el consejo del mdico. Asegrese de hacerle al mdico cualquier pregunta que tenga. Document Revised: 09/22/2017 Document Reviewed: 09/22/2017 Elsevier Patient Education  2020 [employer].

## 2019-11-05 ENCOUNTER — Other Ambulatory Visit: Payer: Self-pay

## 2019-11-05 ENCOUNTER — Encounter (HOSPITAL_COMMUNITY): Payer: Self-pay | Admitting: *Deleted

## 2019-11-05 ENCOUNTER — Emergency Department (HOSPITAL_COMMUNITY)
Admission: EM | Admit: 2019-11-05 | Discharge: 2019-11-05 | Disposition: A | Discharge status: Home / Self Care | Payer: Self-pay | Attending: Emergency Medicine | Admitting: Emergency Medicine

## 2019-11-05 DIAGNOSIS — E119 Type 2 diabetes mellitus without complications: Secondary | ICD-10-CM | POA: Insufficient documentation

## 2019-11-05 DIAGNOSIS — Z79899 Other long term (current) drug therapy: Secondary | ICD-10-CM | POA: Insufficient documentation

## 2019-11-05 DIAGNOSIS — I10 Essential (primary) hypertension: Secondary | ICD-10-CM | POA: Insufficient documentation

## 2019-11-05 DIAGNOSIS — N3001 Acute cystitis with hematuria: Secondary | ICD-10-CM | POA: Insufficient documentation

## 2019-11-05 LAB — URINALYSIS, ROUTINE W REFLEX MICROSCOPIC
Bilirubin Urine: NEGATIVE
Glucose, UA: NEGATIVE mg/dL
Ketones, ur: NEGATIVE mg/dL
Nitrite: NEGATIVE
Protein, ur: 100 mg/dL — AB
RBC / HPF: >50 RBC/hpf — ABNORMAL HIGH (ref 0–5)
Specific Gravity, Urine: 1.019 (ref 1.005–1.030)
WBC, UA: >50 WBC/hpf — ABNORMAL HIGH (ref 0–5)
pH: 6 (ref 5.0–8.0)

## 2019-11-05 MED ORDER — CEPHALEXIN 500 MG PO CAPS: 500.0000 mg | ORAL_CAPSULE | Freq: Four times a day (QID) | ORAL | 0 refills | Status: DC

## 2019-11-05 MED ORDER — CEPHALEXIN 500 MG PO CAPS
500.0000 mg | ORAL_CAPSULE | Freq: Once | ORAL | Status: AC
  Administered 2019-11-05: 500 mg via ORAL
  Filled 2019-11-05: qty 1

## 2019-11-05 NOTE — Discharge Instructions (Addendum)
Please take antibiotics for UTI, call first thing Monday morning to schedule close follow-up with your primary care doctor.  Urinary tract infections or not typical in males.  If you start to develop fevers, abdominal or flank pain.  Rectal pain, pain in the testicles or any other new or concerning symptoms please return to the ED for further evaluation.

## 2019-11-05 NOTE — ED Provider Notes (Signed)
[facility] EMERGENCY DEPARTMENT Provider Note   CSN: [id number] Arrival date & time: 11/05/19  1643     History Chief Complaint  Patient presents with  . Hematuria    Christopher Compton is a 44 y.o. male.  Christopher Compton is a 44 y.o. male with a history of diabetes, hypertension, hernia, obesity, who presents to the emergency department for evaluation of blood in his urine which he noted today.  He also reports he has had some dysuria today.  He denies any associated frequency.  No flank pain or abdominal pain.  No fevers or chills.  No nausea or vomiting.  He denies any associated penile discharge, testicular pain or swelling.  Denies any rectal pain or pain with defecation.  No perineal pain.  He does report that he was recently placed on antibiotics for an early abscess at the gluteal cleft and reports after being on antibiotics for several days the abscess opened up and drained, he reports is still draining a bit but is overall been improving.  He wonders if this could have contributed to his urinary symptoms.  He denies any prior history of urinary tract infections.  Reports he is sexually active with one partner denies any new sexual partners or concern for STDs.  Has never had similar symptoms or blood in his urine.  No history of kidney stones.        Past Medical History:  Diagnosis Date  . Abdominal hernia   . Diabetes mellitus without complication (HCC)    Controlled by diet  . Hypertension     Patient Active Problem List   Diagnosis Date Noted  . Morbid obesity (HCC) 07/16/2015  . Hyperlipidemia 07/16/2015  . Prediabetes 07/16/2015  . Umbilical hernia 02/03/2014    Past Surgical History:  Procedure Laterality Date  . UMBILICAL HERNIA REPAIR N/A 02/03/2014   Procedure: UMBILICAL HERNIA REPAIR ADULT;  Surgeon: Marlane Hatcher, MD;  Location: [facility];  Service: General;  Laterality: N/A;       No family history on file.  Social History   Tobacco  Use  . Smoking status: Never Smoker  . Smokeless tobacco: Never Used  Substance Use Topics  . Alcohol use: No  . Drug use: No    Home Medications Prior to Admission medications   Medication Sig Start Date End Date Taking? Authorizing Provider  cephALEXin (KEFLEX) 500 MG capsule Take 1 capsule (500 mg total) by mouth 4 (four) times daily. 10/12/19   [name], PA-C  terbinafine (LAMISIL) 250 MG tablet 1 po qd.  Tome una tableta por boca diaria Patient not taking: Reported on 10/12/2019 04/16/19   [name], PA-C    Allergies    Patient has no known allergies.  Review of Systems   Review of Systems  Constitutional: Negative for chills and fever.  HENT: Negative.   Respiratory: Negative for cough and shortness of breath.   Cardiovascular: Negative for chest pain.  Gastrointestinal: Negative for abdominal pain, diarrhea, nausea and vomiting.  Genitourinary: Positive for dysuria and hematuria. Negative for discharge, flank pain, frequency, genital sores, penile pain, scrotal swelling and testicular pain.  Musculoskeletal: Negative for arthralgias, back pain and myalgias.  Skin: Negative for color change and rash.  All other systems reviewed and are negative.   Physical Exam Updated Vital Signs BP 129/81 (BP Location: Right Arm)   Pulse 88   Temp 99.6 F (37.6 C) (Oral)   Resp 16   Ht 5\' 9"  (1.753 m)  Wt 119.7 kg   SpO2 99%   BMI 38.99 kg/m   Physical Exam Vitals and nursing note reviewed. Exam conducted with a chaperone present.  Constitutional:      General: He is not in acute distress.    Appearance: Normal appearance. He is well-developed. He is obese. He is not ill-appearing or diaphoretic.     Comments: Obese male, well appearing, and in no distress  HENT:     Head: Normocephalic and atraumatic.  Eyes:     General:        Right eye: No discharge.        Left eye: No discharge.  Cardiovascular:     Rate and Rhythm: Normal rate and regular rhythm.       Heart sounds: Normal heart sounds. No murmur. No friction rub.  Pulmonary:     Effort: Pulmonary effort is normal. No respiratory distress.     Breath sounds: Normal breath sounds. No wheezing or rales.     Comments: Respirations equal and unlabored, patient able to speak in full sentences, lungs clear to auscultation bilaterally Abdominal:     General: Bowel sounds are normal. There is no distension.     Palpations: Abdomen is soft. There is no mass.     Tenderness: There is no abdominal tenderness. There is no guarding.     Comments: Abdomen soft, nondistended, nontender to palpation in all quadrants without guarding or peritoneal signs, no CVA tenderness bilaterally  Musculoskeletal:        General: No deformity.     Cervical back: Neck supple.  Skin:    General: Skin is warm and dry.     Capillary Refill: Capillary refill takes less than 2 seconds.  Neurological:     Mental Status: He is alert.     Coordination: Coordination normal.     Comments: Speech is clear, able to follow commands Moves extremities without ataxia, coordination intact  Psychiatric:        Mood and Affect: Mood normal.        Behavior: Behavior normal.     ED Results / Procedures / Treatments   Labs (all labs ordered are listed, but only abnormal results are displayed) Labs Reviewed  URINALYSIS, ROUTINE W REFLEX MICROSCOPIC - Abnormal; Notable for the following components:      Result Value   APPearance CLOUDY (*)    Hgb urine dipstick LARGE (*)    Protein, ur 100 (*)    Leukocytes,Ua LARGE (*)    RBC / HPF >50 (*)    WBC, UA >50 (*)    Bacteria, UA RARE (*)    All other components within normal limits    EKG None  Radiology No results found.  Procedures Procedures (including critical care time)  Medications Ordered in ED Medications  cephALEXin (KEFLEX) capsule 500 mg (500 mg Oral Given 11/05/19 2005)    ED Course  I have reviewed the triage vital signs and the nursing  notes.  Pertinent labs & imaging results that were available during my care of the patient were reviewed by me and considered in my medical decision making (see chart for details).    MDM Rules/Calculators/A&P                     44 year old male with hematuria and dysuria beginning today.  On arrival he is afebrile and well-appearing.  He has no associated flank or abdominal pain.  No fevers or chills no vomiting.  He has no concern for STDs.  No associated penile discharge, no testicular pain or swelling, doubt epididymitis or orchitis.  No rectal pain or perineal pain, doubt prostatitis.  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  No prior history.  Nontoxic-appearing with no flank pain I have low suspicion for pyelonephritis.  Urinalysis consistent with UTI with hematuria and pyuria with rare bacteria present.  Will treat with Keflex, urine culture sent.  I have stressed the importance of close follow-up with PCP, and discussed that UTIs are atypical in males.  Return precautions discussed.  Patient expresses understanding and agreement with plan.  Discharged home in good condition.  Final Clinical Impression(s) / ED Diagnoses Final diagnoses:  Acute cystitis with hematuria    Rx / DC Orders ED Discharge Orders         Ordered    cephALEXin (KEFLEX) 500 MG capsule  4 times daily     11/05/19 2038           Legrand Rams 11/05/19 2207    Bethann Berkshire, MD 11/06/19 [phone]

## 2019-11-05 NOTE — ED Triage Notes (Signed)
States he noticed blood in his urine today

## 2019-11-08 LAB — URINE CULTURE: Culture: >=100000 — AB

## 2020-04-12 ENCOUNTER — Encounter: Payer: Self-pay | Admitting: Physician Assistant

## 2020-04-12 ENCOUNTER — Ambulatory Visit: Payer: Self-pay | Admitting: Physician Assistant

## 2020-04-12 VITALS — BP 125/78 | HR 72 | Temp 97.4°F | Wt 269.8 lb

## 2020-04-12 DIAGNOSIS — E669 Obesity, unspecified: Secondary | ICD-10-CM

## 2020-04-12 DIAGNOSIS — Z0000 Encounter for general adult medical examination without abnormal findings: Secondary | ICD-10-CM

## 2020-04-12 DIAGNOSIS — L0231 Cutaneous abscess of buttock: Secondary | ICD-10-CM

## 2020-04-12 MED ORDER — CEPHALEXIN 500 MG PO CAPS: 500.0000 mg | ORAL_CAPSULE | Freq: Four times a day (QID) | ORAL | 0 refills | Status: DC

## 2020-04-12 NOTE — Patient Instructions (Signed)
Absceso en la piel Skin Abscess  Un absceso en la piel es una zona infectada en la piel o debajo de la piel que contiene una acumulacin de pus y otros materiales. Al absceso tambin se le puede llamar fornculo, carbunclo o divieso. Un absceso puede presentarse en casi cualquier parte del cuerpo. Algunos abscesos se abren (rompen) por s solos. La mayora sigue empeorando si no se le da tratamiento. La infeccin puede diseminarse hacia otras partes del cuerpo y finalmente llegar a la sangre, lo que puede hacer que usted se sienta mal. Generalmente, el tratamiento consiste en el drenaje del absceso. Cules son las causas? Un absceso se produce cuando los microbios, como las bacterias, pasan a travs de la piel y causan infeccin. Las causas de esto pueden ser las siguientes:  Un rasguo o un corte en la piel.  Una herida por puncin a travs de la piel, incluidas una inyeccin con aguja o la picadura de un insecto.  Obstruccin de las glndulas sebceas o sudorparas.  Obstruccin e infeccin de folculos capilares.  Un quiste que se forma debajo de la piel (quiste sebceo) y se infecta. Qu incrementa el riesgo? Es ms probable que esta afeccin se manifieste en las personas que:  Tienen debilitado el sistema de defensa del organismo (sistema inmunitario).  Tienen diabetes.  Tienen la piel seca e irritada.  Reciben inyecciones con frecuencia o consumen drogas ilegales por va intravenosa.  Tienen un cuerpo extrao en una herida; por ejemplo, una astilla.  Tienen problemas en el sistema linftico o las venas. Cules son los signos o sntomas? Los sntomas de esta afeccin incluyen:  Una protuberancia dolorosa y firme debajo de la piel.  Una protuberancia con pus en la parte superior. El pus puede romper la piel y supurar hacia el exterior. Otros sntomas pueden ser los siguientes:  Enrojecimiento en torno al lugar del absceso.  Calor.  Hinchazn de los ganglios  linfticos (glndulas) cerca del absceso.  Dolor a la palpacin.  Una lcera en la piel. Cmo se diagnostica? Esta afeccin se puede diagnosticar en funcin de lo siguiente:  Un examen fsico.  Sus antecedentes mdicos.  Una muestra de pus. Esta puede utilizarse para averiguar qu est causando la infeccin.  Anlisis de sangre.  Estudios de diagnstico por imgenes, como ecografa, exploracin por tomografa computarizada (TC) o resonancia magntica (RM). Cmo se trata? Un absceso pequeo que drena por s solo puede no necesitar tratamiento. El tratamiento de los abscesos ms grandes pueden incluir lo siguiente:  Aplicacin de una compresa de calor hmedo en la zona varias veces por da.  Un procedimiento para drenar el absceso (incisin y drenaje).  Antibiticos. Para un absceso grave, puede recibir primero antibiticos a travs de una va intravenosa y luego cambiar a los antibiticos por boca. Siga estas indicaciones en su casa: Medicamentos   Tome los medicamentos de venta libre y los recetados solamente como se lo haya indicado el mdico.  Si le recetaron un antibitico, tmelo como se lo haya indicado el mdico. No deje de tomar el antibitico aunque comience a sentirse mejor. Cuidado del absceso   Si usted tiene un absceso que no ha supurado, aplique calor en la zona afectada. Use la fuente de calor que el mdico le recomiende, como una compresa de calor hmedo o una almohadilla trmica. ? Coloque una toalla entre la piel y la fuente de calor. ? Aplique el calor durante 20 a 30minutos. ? Retire la fuente de calor si la piel se   pone de color rojo brillante. Esto es muy importante si no puede sentir dolor, calor o fro. Puede correr un riesgo mayor de sufrir quemaduras.  Siga las indicaciones del mdico acerca del cuidado del absceso. Asegrese de hacer lo siguiente: ? Cubra el absceso con una venda (vendaje). ? Cambie el vendaje o la gasa como se lo haya indicado  el mdico. ? Lvese las manos con agua y jabn antes de cambiar el vendaje o la gasa. Use desinfectante para manos si no dispone de agua y jabn.  Controle el absceso todos los das para observar si hay signos de que la infeccin empeora. Est atento a los siguientes signos: ? Aumento del enrojecimiento, la hinchazn o el dolor. ? Ms lquido o sangre. ? Calor. ? Mal olor o ms pus. Indicaciones generales  Para evitar la propagacin de la infeccin: ? No comparta artculos de cuidado personal, toallas ni jacuzzis con otras personas. ? Evite el contacto piel con piel con otras personas.  Concurra a todas las visitas de control como se lo haya indicado el mdico. Esto es importante. Comunquese con un mdico si tiene:  Aumento del enrojecimiento, la hinchazn o el dolor alrededor del absceso.  Aumento de la cantidad de lquido o sangre que sale del absceso.  La piel que rodea el absceso se siente caliente.  Ms pus o un mal olor que proviene del absceso.  Fiebre.  Dolores musculares.  Escalofros o sensacin general de malestar. Solicite ayuda inmediatamente si:  Tiene dolor intenso.  Observa lneas rojas en la piel que se extienden desde el absceso. Resumen  Un absceso en la piel es una zona infectada en la piel o debajo de la piel que contiene una acumulacin de pus y otros materiales.  Un absceso pequeo que drena por s solo puede no necesitar tratamiento.  El tratamiento de los abscesos ms grandes puede incluir un procedimiento para drenar el absceso y la toma de un antibitico. Esta informacin no tiene como fin reemplazar el consejo del mdico. Asegrese de hacerle al mdico cualquier pregunta que tenga. Document Revised: 09/22/2017 Document Reviewed: 09/22/2017 Elsevier Patient Education  2020 [employer] Inc.  

## 2020-04-12 NOTE — Progress Notes (Signed)
BP 125/78   Pulse 72   Temp (!) 97.4 F (36.3 C)   Wt 269 lb 12.8 oz (122.4 kg)   SpO2 96%   BMI 39.84 kg/m    Subjective:    Patient ID: Christopher Compton, male    DOB: 1975/09/29, 44 y.o.   MRN: [id number]  HPI: Naim Murtha is a 44 y.o. male presenting on 04/12/2020 for No chief complaint on file.   HPI  Pt had a negative covid 19 screening questionnaire.    Pt is a 44yoM who presents for annual exam.   He says he is doing well.  He does not exercise regularly but says he is active and participates in sports sometimes.  He got his covid vaccination-  [employer] 10/02/19 Lot [id number]   Joint event- fcdph/wfbh  Pt says he is doing well except that he has the abscess on his bottom that he was seen for earlier this year.  He says it improved after the antibiotic but never completely went away.   He says the bump on his bottom is his only complaint.  He says otherwise he is doing well.      Relevant past medical, surgical, family and social history reviewed and updated as indicated. Interim medical history since our last visit reviewed. Allergies and medications reviewed and updated.  CURRENT MEDS: Fish oil- takes "sometimes"   Review of Systems  Per HPI unless specifically indicated above     Objective:    BP 125/78   Pulse 72   Temp (!) 97.4 F (36.3 C)   Wt 269 lb 12.8 oz (122.4 kg)   SpO2 96%   BMI 39.84 kg/m   Wt Readings from Last 3 Encounters:  04/12/20 269 lb 12.8 oz (122.4 kg)  11/05/19 264 lb (119.7 kg)  10/12/19 266 lb 8 oz (120.9 kg)    Physical Exam Vitals reviewed. Exam conducted with a chaperone present (CV assisted).  Constitutional:      General: He is not in acute distress.    Appearance: He is well-developed. He is obese. He is not ill-appearing.  HENT:     Head: Normocephalic and atraumatic.  Cardiovascular:     Rate and Rhythm: Normal rate and regular rhythm.  Pulmonary:     Effort: Pulmonary effort is normal.     Breath  sounds: Normal breath sounds. No wheezing.  Abdominal:     General: Bowel sounds are normal.     Palpations: Abdomen is soft.     Tenderness: There is no abdominal tenderness.  Genitourinary:    Rectum: No mass, tenderness or external hemorrhoid.     Comments: Skin rash/infected area approximately 2 inches superior to anus in the gluteal cleft.  It is about 1 cm in diameter.  There is no fluctuance and the area is not raised.  It is mildly erythematous and appears to be weeping.  It is tender.   Musculoskeletal:     Cervical back: Neck supple.     Right lower leg: No edema.     Left lower leg: No edema.  Lymphadenopathy:     Cervical: No cervical adenopathy.  Skin:    General: Skin is warm and dry.  Neurological:     Mental Status: He is alert and oriented to person, place, and time.     Motor: No weakness or tremor.     Gait: Gait is intact. Gait normal.     Deep Tendon Reflexes:     Reflex Scores:  Patellar reflexes are 2+ on the right side and 2+ on the left side. Psychiatric:        Attention and Perception: Attention normal.        Speech: Speech normal.        Behavior: Behavior normal. Behavior is cooperative.            Assessment & Plan:    Encounter Diagnoses  Name Primary?  . Encounter for annual health examination Yes  . Cutaneous abscess of buttock   . Obesity, unspecified classification, unspecified obesity type, unspecified whether serious comorbidity present      -for this skin infection, Pt is given rx keflex.  He is counseled to keep the area clean and dry.  Recommended he wear natural fibers and avoid too tight clothing.  Since this appears to be a lingering infection, will have pt RTO in 2 weeks to recheck the area.  He is to contact office sooner for any worsening or new symptoms -encouraged healthy diet and regular exercise -will plan to recheck lipids next year

## 2020-04-13 ENCOUNTER — Ambulatory Visit: Payer: Self-pay | Admitting: Physician Assistant

## 2020-04-26 ENCOUNTER — Ambulatory Visit: Payer: Self-pay | Admitting: Physician Assistant

## 2020-04-26 ENCOUNTER — Encounter: Payer: Self-pay | Admitting: Physician Assistant

## 2020-04-26 VITALS — BP 110/80 | HR 84 | Temp 98.4°F | Ht 66.5 in | Wt 266.0 lb

## 2020-04-26 DIAGNOSIS — S31809D Unspecified open wound of unspecified buttock, subsequent encounter: Secondary | ICD-10-CM

## 2020-04-26 NOTE — Progress Notes (Signed)
   BP 110/80   Pulse 84   Temp 98.4 F (36.9 C)   Ht 5' 6.5" (1.689 m)   Wt 266 lb (120.7 kg)   SpO2 96%   BMI 42.29 kg/m    Subjective:    Patient ID: Christopher Compton, male    DOB: 1975-09-26, 44 y.o.   MRN: [id number]  HPI: Christopher Compton is a 44 y.o. male presenting on 04/26/2020 for Mass (pt states no more puss but is still bleeding.)   HPI  Pt had a negative covid 19 screening questionnaire.   Pt is 44yoM who presents for recheck abscess gluteal cleft.  He took the keflex he was given at 04/12/20 appointment.  He was also given antibiotic for the same lesion when he was seen for it on 10/12/19.    Relevant past medical, surgical, family and social history reviewed and updated as indicated. Interim medical history since our last visit reviewed. Allergies and medications reviewed and updated.   Current Outpatient Medications:  .  Omega-3 Fatty Acids (FISH OIL PO), Take by mouth., Disp: , Rfl:    Review of Systems  Per HPI unless specifically indicated above     Objective:    BP 110/80   Pulse 84   Temp 98.4 F (36.9 C)   Ht 5' 6.5" (1.689 m)   Wt 266 lb (120.7 kg)   SpO2 96%   BMI 42.29 kg/m   Wt Readings from Last 3 Encounters:  04/26/20 266 lb (120.7 kg)  04/12/20 269 lb 12.8 oz (122.4 kg)  11/05/19 264 lb (119.7 kg)    Physical Exam Exam conducted with a chaperone present.  Constitutional:      General: He is not in acute distress.    Appearance: He is obese. He is not ill-appearing.  HENT:     Head: Normocephalic and atraumatic.  Pulmonary:     Effort: Pulmonary effort is normal. No respiratory distress.  Genitourinary:    Comments: Draining wound gluteal cleft.  No erythema.  No fluctuance. Neurological:     Mental Status: He is alert and oriented to person, place, and time.  Psychiatric:        Behavior: Behavior normal.            Assessment & Plan:    Encounter Diagnosis  Name Primary?  Marland Kitchen Wound of gluteal cleft,  unspecified laterality, subsequent encounter Yes     -Refer to surgeon for persistent unresolving wound gluteal cleft, possible pilonidal cyst -He has [employer] financial assistance -follow up 1 year.  Pt to contact office sooner prn

## 2020-05-30 ENCOUNTER — Ambulatory Visit (INDEPENDENT_AMBULATORY_CARE_PROVIDER_SITE_OTHER): Payer: Self-pay | Admitting: General Surgery

## 2020-05-30 ENCOUNTER — Encounter: Payer: Self-pay | Admitting: General Surgery

## 2020-05-30 ENCOUNTER — Other Ambulatory Visit: Payer: Self-pay

## 2020-05-30 VITALS — BP 130/81 | HR 73 | Temp 98.2°F | Resp 18 | Ht 69.0 in | Wt 279.0 lb

## 2020-05-30 DIAGNOSIS — K603 Anal fistula: Secondary | ICD-10-CM

## 2020-05-30 NOTE — Patient Instructions (Signed)
Fstula anal Anal Fistula  Una fstula anal es un orificio que se forma entre el intestino y la piel que est cerca del ano. El ano permite que la materia fecal (heces) salga del organismo. En el ano, hay muchas glndulas diminutas que producen lquido lubricante. A veces, estas glndulas se tapan y se infectan. Esto puede causar que se forme un saco lleno de lquido (absceso). La fstula anal generalmente se produce cuando un absceso se infecta y luego forma un orificio entre el intestino y Press photographer. Cules son las causas? En la [employer], la causa de la fstula anal es una acumulacin actual o anterior de pus alrededor del ano (absceso anal). Otras causas son las siguientes:  [place] complicacin [place].  Lesin en el recto o la zona que lo rodea.  Uso de rayos de alta potencia (radiacin) para tratar la zona que rodea al recto. Qu incrementa el riesgo? Es ms probable que usted desarrolle esta afeccin si tiene ciertas afecciones o enfermedades, como:  Una enfermedad inflamatoria del intestino crnica, como la enfermedad de Crohn o la colitis ulcerosa.  Cncer de colon o de recto.  Enfermedad diverticular, como diverticulitis.  Una infeccin de transmisin sexual o ITS, como gonorrea, clamidia o sfilis.  Una infeccin causada por el VIH. Cules son los signos o los sntomas? Los sntomas de esta afeccin incluyen los siguientes:  Dolor pulstil o permanente que puede ser ms intenso al estar sentado.  Hinchazn o irritacin alrededor del ano.  Salida de pus o sangre desde un orificio cerca del ano.  Dolor al defecar.  Fiebre o escalofros. Cmo se diagnostica? Esta afeccin se diagnostica en funcin de lo siguiente:  Un examen fsico. Estas medidas pueden incluir: ? Un examen para encontrar el orificio externo de la fstula. ? Un examen con Neomia Dear sonda o un endoscopio para ayudar a Information systems manager orificio interno de la fstula. ? Un examen del recto con mano  enguantada (examen rectal digital).  Estudios de diagnstico por imgenes que utilizan una sustancia de contraste para hallar la ubicacin exacta y el trayecto de la fstula. Las pruebas pueden incluir las siguientes: ? Radiografas. ? Ecografa. ? Exploracin por tomografa computarizada (TC). ? Resonancia magntica (RM).  Otros estudios para Production assistant, radio causa de la fstula anal. Cmo se trata? El tratamiento ms frecuente para esta afeccin es la [place]. El tipo de [place] que se realice depender de la ubicacin y de la complejidad de la fstula. La ciruga puede incluir lo siguiente:  Una fistulotoma. Se procede a la apertura de toda la fstula y se drena el contenido para Public affairs consultant.  La colocacin de un setn. Se coloca un hilo de seda (setn) en la fstula durante una fistulotoma. Esto ayuda a Multimedia programmer infeccin y a Public affairs consultant.  Procedimiento de colgajo de avance. Se extrae tejido del recto o de la piel alrededor del ano y se lo une al orificio de la fstula.  Tapn bioprotsico. Se hace un tapn con forma de cono a partir del propio tejido y se lo [place] para obstruir el orificio de la fstula. Algunas fstulas anales no requieren [place]. Una opcin de tratamiento no quirrgica incluye la inyeccin de un [place] de fibrina para sellar la fstula. Posiblemente le indiquen un antibitico para tratar la infeccin que hubiere. Siga estas indicaciones en su casa: Medicamentos  [employer] de venta libre y los recetados solamente como se lo haya indicado el mdico.  Si le recetaron un  antibitico, tmelo como se lo haya indicado el mdico. No deje de tomar el antibitico, aunque comience a sentirse mejor.  Tome un ablandador de heces o un laxante si se lo indic el mdico. Indicaciones generales   Consuma una dieta con alto contenido de fibra como se lo haya indicado el mdico. Esto puede ayudar a evitar el estreimiento.  Beba  suficiente lquido como para Pharmacologistmantener la orina de color amarillo plido.  Tome baos de asiento durante 15 a 20minutos, 3 o 4veces por Futures traderda, o como se lo haya indicaChief Strategy Officerdo el mdico. Los baos de asiento pueden Engineer, materialsaliviar el dolor y las [place]molestias, y ayudar a Comptrollerla cicatrizacin.  Mantenga una higiene adecuada para que la zona anal est tan limpia y seca como sea posible. Use papel higinico hmedo o una toallita hmeda despus de cada deposicin.  Concurra a todas las visitas de control como se lo haya indicado el mdico. Esto es importante. Comunquese con un mdico si tiene:  Aumento del dolor y no puede controlarlo con medicamentos.  Un nuevo episodio de hinchazn o enrojecimiento alrededor de la zona anal.  Nuevamente emana lquido, sangre o pus de la zona anal.  Dolor con la palpacin o sensacin de calor alrededor de la zona anal. Solicite ayuda inmediatamente si tiene:  Teacher, English as a foreign languageiebre.  Dolor intenso.  Escalofros o diarrea.  Problemas graves para Geographical information systems officerorinar o Licensed conveyancermover el intestino. Resumen  Una fstula anal es un orificio que se forma entre el intestino y la piel que est cerca del ano.  La causa ms frecuente de esta afeccin es una acumulacin de pus alrededor del ano (absceso anal). Otras causas incluyen una complicacin de la ciruga, una lesin en el recto o el uso de radiacin para tratar el rea rectal.  El tratamiento ms frecuente para esta afeccin es la [place].  Siga las indicaciones del mdico acerca de tomar medicamentos, comer y beber, o tomar baos de asiento.  Comunquese con el mdico si usted tiene ms dolor o hinchazn, o Advertising account plannersi le sale sangre. Solicite ayuda de inmediato si tiene fiebre, dolor intenso o problemas para eliminar la orina o las heces. Esta informacin no tiene Theme park managercomo fin reemplazar el consejo del mdico. Asegrese de hacerle al mdico cualquier pregunta que tenga. Document Revised: 12/02/2017 Document Reviewed: 12/02/2017 Elsevier Patient Education  2020 Elsevier  Inc. Anal Fistula  An anal fistula is a hole that develops between the bowel and the skin near the anus. The anus allows stool (feces) to leave the body. The anus has many tiny glands that make lubricating fluid. Sometimes, these glands become plugged and infected. This can cause a fluid-filled pocket (abscess) to form. An anal fistula often occurs when an abscess becomes infected and then develops into a hole between the bowel and the skin. What are the causes? In most cases, an anal fistula is caused by a past or current buildup of pus around the anus (anal abscess). Other causes include:  A complication of surgery.  Injury to the rectum or the area around it.  Using high-energy beams (radiation) to treat the area around the rectum. What increases the risk? You are more likely to develop this condition if you have certain medical conditions or diseases, including:  Chronic inflammatory bowel disease, such as Crohn's disease or ulcerative colitis.  Colon cancer or rectal cancer.  Diverticular disease, such as diverticulitis.  A sexually transmitted infection, or STI, such as gonorrhea, chlamydia, or syphilis.  An infection that is caused by HIV. What are the  signs or symptoms? Symptoms of this condition include:  Throbbing or constant pain that may be worse while you are sitting.  Swelling or irritation around the anus.  Pus or blood from an opening near the anus.  Pain when passing stool.  Fever or chills. How is this diagnosed? This condition is diagnosed based on:  A physical exam. This may include: ? An exam to find the external opening of the fistula. ? An exam with a probe or scope to help locate the internal opening of the fistula. ? An exam of the rectum with a gloved hand (digital rectal exam).  Imaging tests that use dye to find the exact location and path of the fistula. Tests may include: ? X-rays. ? Ultrasound. ? CT scan. ? MRI.  Other tests to find the  cause of the anal fistula. How is this treated? This condition is most commonly treated with surgery. The type of surgery that is used will depend on where the fistula is located and how complex the fistula is. Surgery may include:  A fistulotomy. The whole fistula is opened up, and the contents are drained to promote healing.  Seton placement. A silk string (seton) is placed into the fistula during a fistulotomy. This helps to drain any infection and promote healing.  Advancement flap procedure. Tissue is removed from your rectum or the skin around the anus and attached to the opening of the fistula.  Bioprosthetic plug. A cone-shaped plug is made from your tissue and is used to block the opening of the fistula. Some anal fistulas do not require surgery. A nonsurgical treatment option involves injecting a fibrin glue to seal the fistula. You also may be prescribed an antibiotic medicine to treat any infection. Follow these instructions at home: Medicines  Take over-the-counter and prescription medicines only as told by your health care provider.  If you were prescribed an antibiotic medicine, take it as told by your health care provider. Do not stop taking the antibiotic even if you start to feel better.  Use a stool softener or a laxative if told to do so by your health care provider. General instructions   Eat a high-fiber diet as told by your health care provider. This can help to prevent constipation.  Drink enough fluid to keep your urine pale yellow.  Take a warm sitz bath for 15-20 minutes, 3-4 times per day, or as told by your health care provider. Sitz baths can ease your pain and discomfort and help with healing.  Follow good hygiene to keep the anal area as clean and dry as possible. Use wet toilet paper or a moist towelette after each bowel movement.  Keep all follow-up visits as told by your health care provider. This is important. Contact a health care provider if you  have:  Increased pain that is not controlled with medicines.  New redness or swelling around the anal area.  New fluid, blood, or pus coming from the anal area.  Tenderness or warmth around the anal area. Get help right away if you have:  A fever.  Severe pain.  Chills or diarrhea.  Severe problems urinating or having a bowel movement. Summary  An anal fistula is a hole that develops between the bowel and the skin near the anus.  This condition is most often caused by a buildup of pus around the anus (anal abscess). Other causes include a complication of surgery, an injury to the rectum, or the use of radiation to treat  the rectal area.  This condition is most commonly treated with surgery.  Follow your health care provider's instructions about taking medicines, eating and drinking, or taking sitz baths.  Call your health care provider if you have more pain, swelling, or blood. Get help right away if you have fever, severe pain, or problems passing urine or stool. This information is not intended to replace advice given to you by your health care provider. Make sure you discuss any questions you have with your health care provider. Document Revised: 10/24/2017 Document Reviewed: 10/24/2017 Elsevier Patient Education  2020 [employer].

## 2020-05-30 NOTE — Progress Notes (Signed)
Christopher Compton; [id number]; 04-Sep-1975   HPI Patient is a 44 year old Hispanic male who was referred to my care by [name] for evaluation and treatment of a wound in the gluteal cleft.  Patient states has been present for many months.  He recently was started on antibiotic and when he placed warm compresses on it, the ruptured.  Since that time, the wound has been slowly healing.  It is irritating on occasion. Past Medical History:  Diagnosis Date  . Abdominal hernia   . Diabetes mellitus without complication (HCC)    Controlled by diet  . Hypertension     Past Surgical History:  Procedure Laterality Date  . UMBILICAL HERNIA REPAIR N/A 02/03/2014   Procedure: UMBILICAL HERNIA REPAIR ADULT;  Surgeon: Marlane Hatcher, MD;  Location: [facility];  Service: General;  Laterality: N/A;    History reviewed. No pertinent family history.  Current Outpatient Medications on File Prior to Visit  Medication Sig Dispense Refill  . Omega-3 Fatty Acids (FISH OIL PO) Take by mouth.     No current facility-administered medications on file prior to visit.    No Known Allergies  Social History   Substance and Sexual Activity  Alcohol Use No    Social History   Tobacco Use  Smoking Status Never Smoker  Smokeless Tobacco Never Used    Review of Systems  Constitutional: Negative.   HENT: Negative.   Eyes: Negative.   Respiratory: Negative.   Cardiovascular: Negative.   Gastrointestinal: Negative.   Genitourinary: Negative.   Musculoskeletal: Negative.   Skin: Negative.   Neurological: Negative.   Endo/Heme/Allergies: Negative.   Psychiatric/Behavioral: Negative.     Objective   Vitals:   05/30/20 1345  BP: 130/81  Pulse: 73  Resp: 18  Temp: 98.2 F (36.8 C)  SpO2: 95%    Physical Exam Vitals reviewed.  Constitutional:      Appearance: Normal appearance. He is not ill-appearing.  HENT:     Head: Normocephalic and atraumatic.  Cardiovascular:     Rate and  Rhythm: Normal rate and regular rhythm.     Heart sounds: Normal heart sounds. No murmur heard.  No friction rub. No gallop.   Pulmonary:     Effort: Pulmonary effort is normal. No respiratory distress.     Breath sounds: Normal breath sounds. No stridor. No wheezing, rhonchi or rales.  Genitourinary:    Comments: Approximately 2 to 3 cm from the anal verge posteriorly in between the gluteal clefts of the perineum, a small healing wound is present.  There does seem to be a tunneling in the subcutaneous tissue towards the anus.  No purulent drainage was expressed.  No specific hole was present. Skin:    General: Skin is warm and dry.  Neurological:     Mental Status: He is alert and oriented to person, place, and time.     Assessment  Perianal fistula, resolving Plan   I told the patient to keep the area clean and dry.  He may put Neosporin ointment on it as needed given the small superficial wound present.  He will watch this.  Should it flareup again, he may need surgical exploration of the presumed fistulous tract.  Literature was given.  Follow-up expectantly.

## 2020-08-29 ENCOUNTER — Other Ambulatory Visit: Payer: Self-pay

## 2020-08-29 ENCOUNTER — Encounter: Payer: Self-pay | Admitting: Physician Assistant

## 2020-08-29 ENCOUNTER — Ambulatory Visit: Payer: Self-pay | Admitting: Physician Assistant

## 2020-08-29 ENCOUNTER — Ambulatory Visit (HOSPITAL_COMMUNITY)
Admission: RE | Admit: 2020-08-29 | Discharge: 2020-08-29 | Disposition: A | Discharge status: Home / Self Care | Payer: Self-pay | Source: Ambulatory Visit | Attending: Physician Assistant | Admitting: Physician Assistant

## 2020-08-29 VITALS — BP 124/80 | HR 66 | Temp 97.5°F | Wt 267.0 lb

## 2020-08-29 DIAGNOSIS — R1084 Generalized abdominal pain: Secondary | ICD-10-CM | POA: Insufficient documentation

## 2020-08-29 NOTE — Patient Instructions (Addendum)
Water and miralax  Call Monday to let us know how you are doing

## 2020-08-29 NOTE — Progress Notes (Signed)
BP 110/80   Pulse 66   Temp (!) 97.5 F (36.4 C)   Wt 267 lb (121.1 kg)   SpO2 99%   BMI 39.43 kg/m    Subjective:    Patient ID: Christopher Compton, male    DOB: 01-25-1976, 45 y.o.   MRN: [id number]  HPI: Christopher Compton is a 45 y.o. male presenting on 08/29/2020 for Abdominal Pain   HPI   Pt had a negative covid 19 screening questionnaire.   Chief Complaint  Patient presents with  . Abdominal Pain     Pt says his Stomach has been not good x 2 weeks.  He says he has ha A little bit of diarrheea.  He tried pepto bismol.   He Then reports constipation.  He tried drinking beet juice.  Now it is sometimes making his back hurt.    He says the abd pain comes and goes.  It is sometimes better and sometimes worse after he eats.    No emesis.   Marland Kitchen  His Last BM was this morning; he says it was very hard.    He Got J&J covid vaccine in April but has not yet gotten booster.  He says his fistula busted again.  He says last time was 3 or 4 days ago.  He last went to surgeon for the rectal fistula in December.  He is eating normally.  He is working Associate Professor.       Relevant past medical, surgical, family and social history reviewed and updated as indicated. Interim medical history since our last visit reviewed. Allergies and medications reviewed and updated.   Current Outpatient Medications:  .  Omega-3 Fatty Acids (FISH OIL PO), Take by mouth., Disp: , Rfl:    Review of Systems  Per HPI unless specifically indicated above     Objective:    BP 110/80   Pulse 66   Temp (!) 97.5 F (36.4 C)   Wt 267 lb (121.1 kg)   SpO2 99%   BMI 39.43 kg/m   Wt Readings from Last 3 Encounters:  08/29/20 267 lb (121.1 kg)  05/30/20 279 lb (126.6 kg)  04/26/20 266 lb (120.7 kg)    Physical Exam Exam conducted with a chaperone present.  Constitutional:      General: He is not in acute distress.    Appearance: He is obese. He is not toxic-appearing.  HENT:      Head: Normocephalic and atraumatic.  Cardiovascular:     Rate and Rhythm: Normal rate and regular rhythm.     Heart sounds: Normal heart sounds.  Pulmonary:     Effort: Pulmonary effort is normal. No respiratory distress.     Breath sounds: Normal breath sounds. No wheezing or rhonchi.  Abdominal:     General: Bowel sounds are normal.     Palpations: Abdomen is soft. There is no fluid wave, hepatomegaly, splenomegaly, mass or pulsatile mass.     Tenderness: There is generalized abdominal tenderness. There is no right CVA tenderness, left CVA tenderness, guarding or rebound.  Genitourinary:    Rectum: Tenderness present. No external hemorrhoid.     Comments: Anal fissure area not open or draining but not well-healed and appears recently inflamed with mild tenderness  (CMA Mekeia chaperone) Musculoskeletal:     Cervical back: Neck supple.     Right lower leg: No edema.     Left lower leg: No edema.  Lymphadenopathy:     Cervical: No cervical adenopathy.  Neurological:     Mental Status: He is alert.  Psychiatric:        Attention and Perception: Attention normal.        Mood and Affect: Mood normal.        Speech: Speech normal.        Behavior: Behavior normal. Behavior is cooperative.             Assessment & Plan:     Encounter Diagnosis  Name Primary?  . Generalized abdominal pain Yes     -Water & miralax -kub today -pt counseled to take good care of rectal area as instructed by surgeon in december -pt encouraged to get covid booster -pt to call next week to notify if improved or not.  He is to contact office sooner for worsening or new symptoms

## 2020-08-31 ENCOUNTER — Telehealth: Payer: Self-pay

## 2020-08-31 NOTE — Telephone Encounter (Signed)
Called to let pt know that xray does not show anything that would cause him pain. He should use the miralax and drink plenty of water as discussed at OV.  He should call next week if he is not improved.

## 2020-11-07 ENCOUNTER — Telehealth: Payer: Self-pay | Admitting: Physician Assistant

## 2020-11-07 NOTE — Telephone Encounter (Signed)
Pt called stating he is constipated since Sunday.  This started after he discontinued miralax.  Pt is encouraged to resume miralax and drink plenty of water.  He is to contact office if he fails to improve with the miralax.

## 2021-03-26 ENCOUNTER — Other Ambulatory Visit: Payer: Self-pay

## 2021-03-26 ENCOUNTER — Encounter: Payer: Self-pay | Admitting: Physician Assistant

## 2021-03-26 ENCOUNTER — Ambulatory Visit: Payer: Self-pay | Admitting: Physician Assistant

## 2021-03-26 VITALS — BP 133/81 | HR 67 | Temp 97.3°F | Wt 270.0 lb

## 2021-03-26 DIAGNOSIS — Z0000 Encounter for general adult medical examination without abnormal findings: Secondary | ICD-10-CM

## 2021-03-26 DIAGNOSIS — E669 Obesity, unspecified: Secondary | ICD-10-CM

## 2021-03-26 DIAGNOSIS — K5900 Constipation, unspecified: Secondary | ICD-10-CM

## 2021-03-26 NOTE — Progress Notes (Signed)
BP 133/81   Pulse 67   Temp (!) 97.3 F (36.3 C)   Wt 270 lb (122.5 kg)   SpO2 97%   BMI 39.87 kg/m    Subjective:    Patient ID: Christopher Compton, male    DOB: 14-Aug-1975, 45 y.o.   MRN: [id number]  HPI: Nicoli Nardozzi is a 45 y.o. male presenting on 03/26/2021 for Annual Exam   HPI   Pt had a negative covid 19 screening questionnaire.   Chief Complaint  Patient presents with   Annual Exam     Pt is working doing Set designer work.  Pt has constipation at times.  He uses miralax when needed and it helps.      Relevant past medical, surgical, family and social history reviewed and updated as indicated. Interim medical history since our last visit reviewed. Allergies and medications reviewed and updated.   Current Outpatient Medications:    polyethylene glycol (MIRALAX / GLYCOLAX) 17 g packet, Take 17 g by mouth daily., Disp: , Rfl:      Review of Systems  Per HPI unless specifically indicated above     Objective:    BP 133/81   Pulse 67   Temp (!) 97.3 F (36.3 C)   Wt 270 lb (122.5 kg)   SpO2 97%   BMI 39.87 kg/m   Wt Readings from Last 3 Encounters:  03/26/21 270 lb (122.5 kg)  08/29/20 267 lb (121.1 kg)  05/30/20 279 lb (126.6 kg)    Physical Exam Vitals reviewed.  Constitutional:      General: He is not in acute distress.    Appearance: He is well-developed. He is obese. He is not ill-appearing.  HENT:     Head: Normocephalic and atraumatic.     Right Ear: Tympanic membrane, ear canal and external ear normal.     Left Ear: Tympanic membrane, ear canal and external ear normal.  Eyes:     Extraocular Movements: Extraocular movements intact.     Conjunctiva/sclera: Conjunctivae normal.     Pupils: Pupils are equal, round, and reactive to light.  Cardiovascular:     Rate and Rhythm: Normal rate and regular rhythm.  Pulmonary:     Effort: Pulmonary effort is normal.     Breath sounds: Normal breath sounds. No wheezing.   Abdominal:     General: Bowel sounds are normal.     Palpations: Abdomen is soft.     Tenderness: There is no abdominal tenderness.  Musculoskeletal:     Cervical back: Neck supple.     Right lower leg: No edema.     Left lower leg: No edema.  Lymphadenopathy:     Cervical: No cervical adenopathy.  Skin:    General: Skin is warm and dry.  Neurological:     Mental Status: He is alert and oriented to person, place, and time.     Motor: No weakness or tremor.     Gait: Gait is intact.  Psychiatric:        Attention and Perception: Attention normal.        Speech: Speech normal.        Behavior: Behavior normal. Behavior is cooperative.          Assessment & Plan:    Encounter Diagnoses  Name Primary?   Encounter for annual health examination Yes   Obesity, unspecified classification, unspecified obesity type, unspecified whether serious comorbidity present    Constipation, unspecified constipation type       -  pt is Counseled on constipation again with encouraging plenty of water and fiber and miralax when needed -pt is counseled on weight management with healthy diet and regular exercise -pt is educated and encouraged to get covid booster and flu shot -pt to Follow up 1 year.  He is to contact office sooner prn

## 2021-08-05 IMAGING — DX DG ABDOMEN 1V
2 series · 2 of 2 positions shown · non-contrast
Comparison: None.

CLINICAL DATA: Abdominal pain for 2 weeks

EXAM:
ABDOMEN - 1 VIEW

[abdomen kub (1 of 2)]
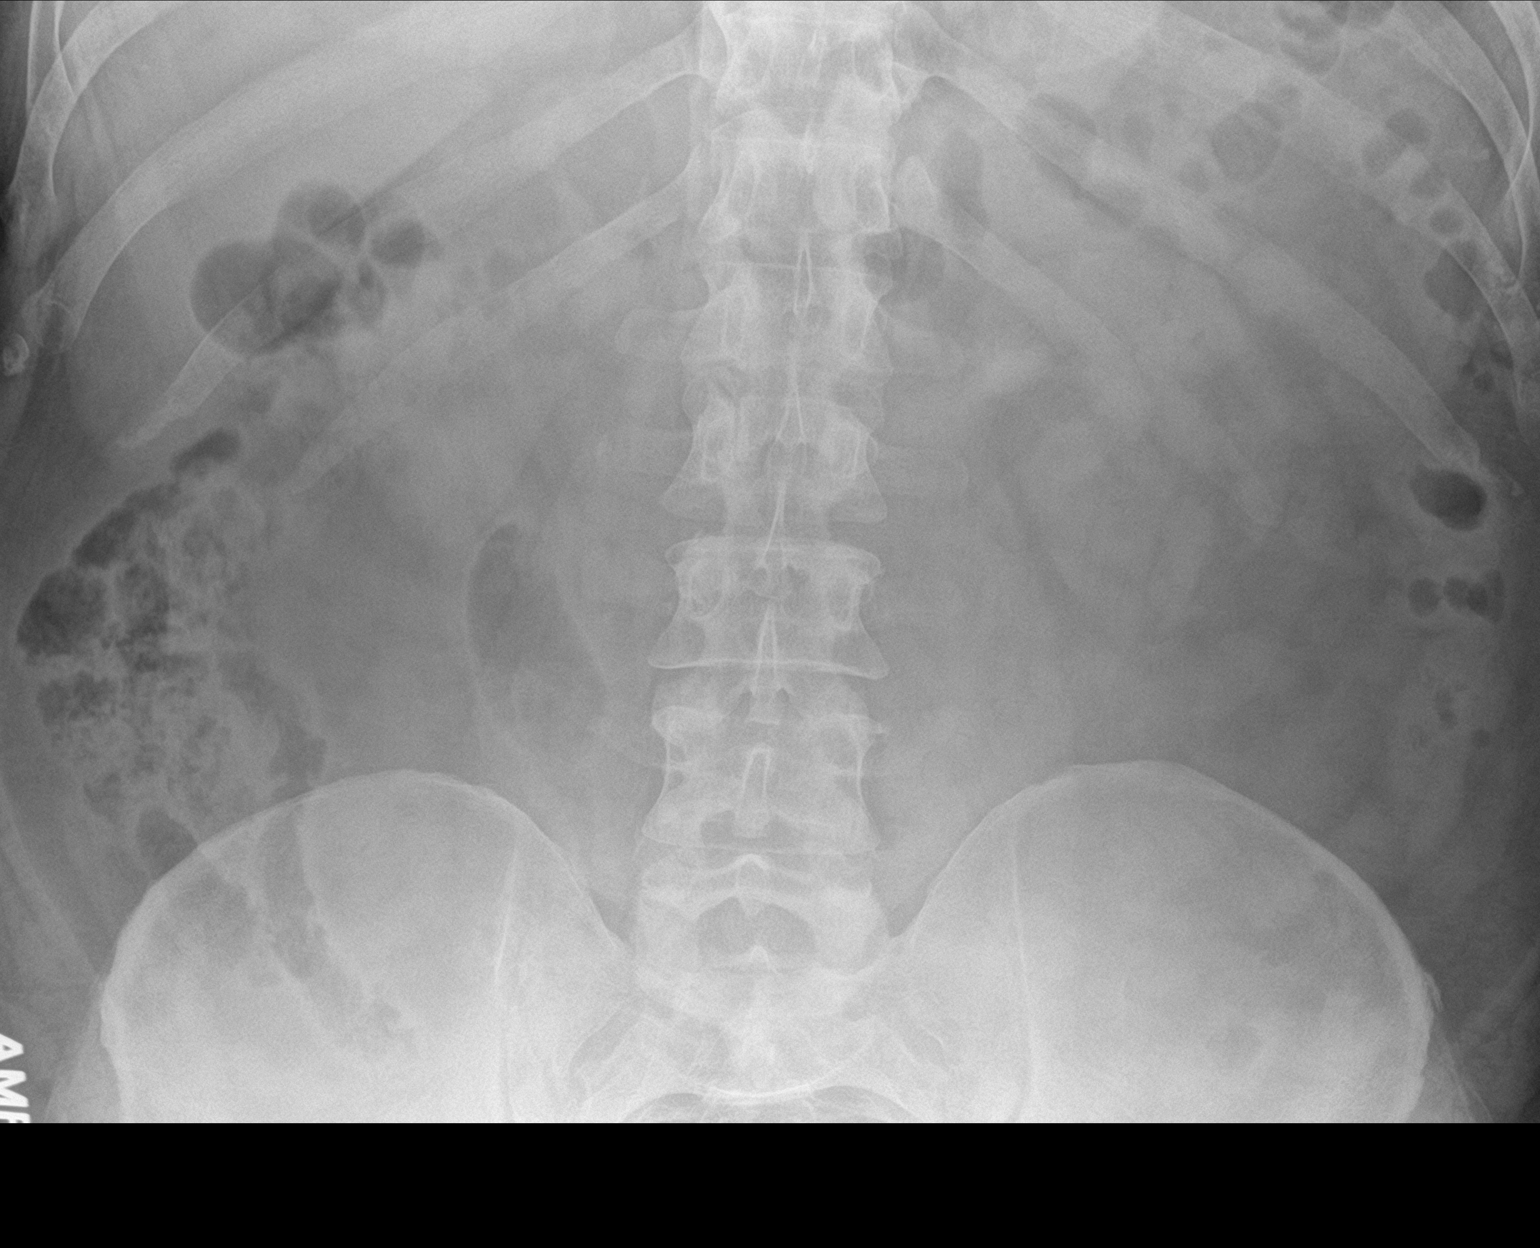

[abdomen kub (2 of 2)]
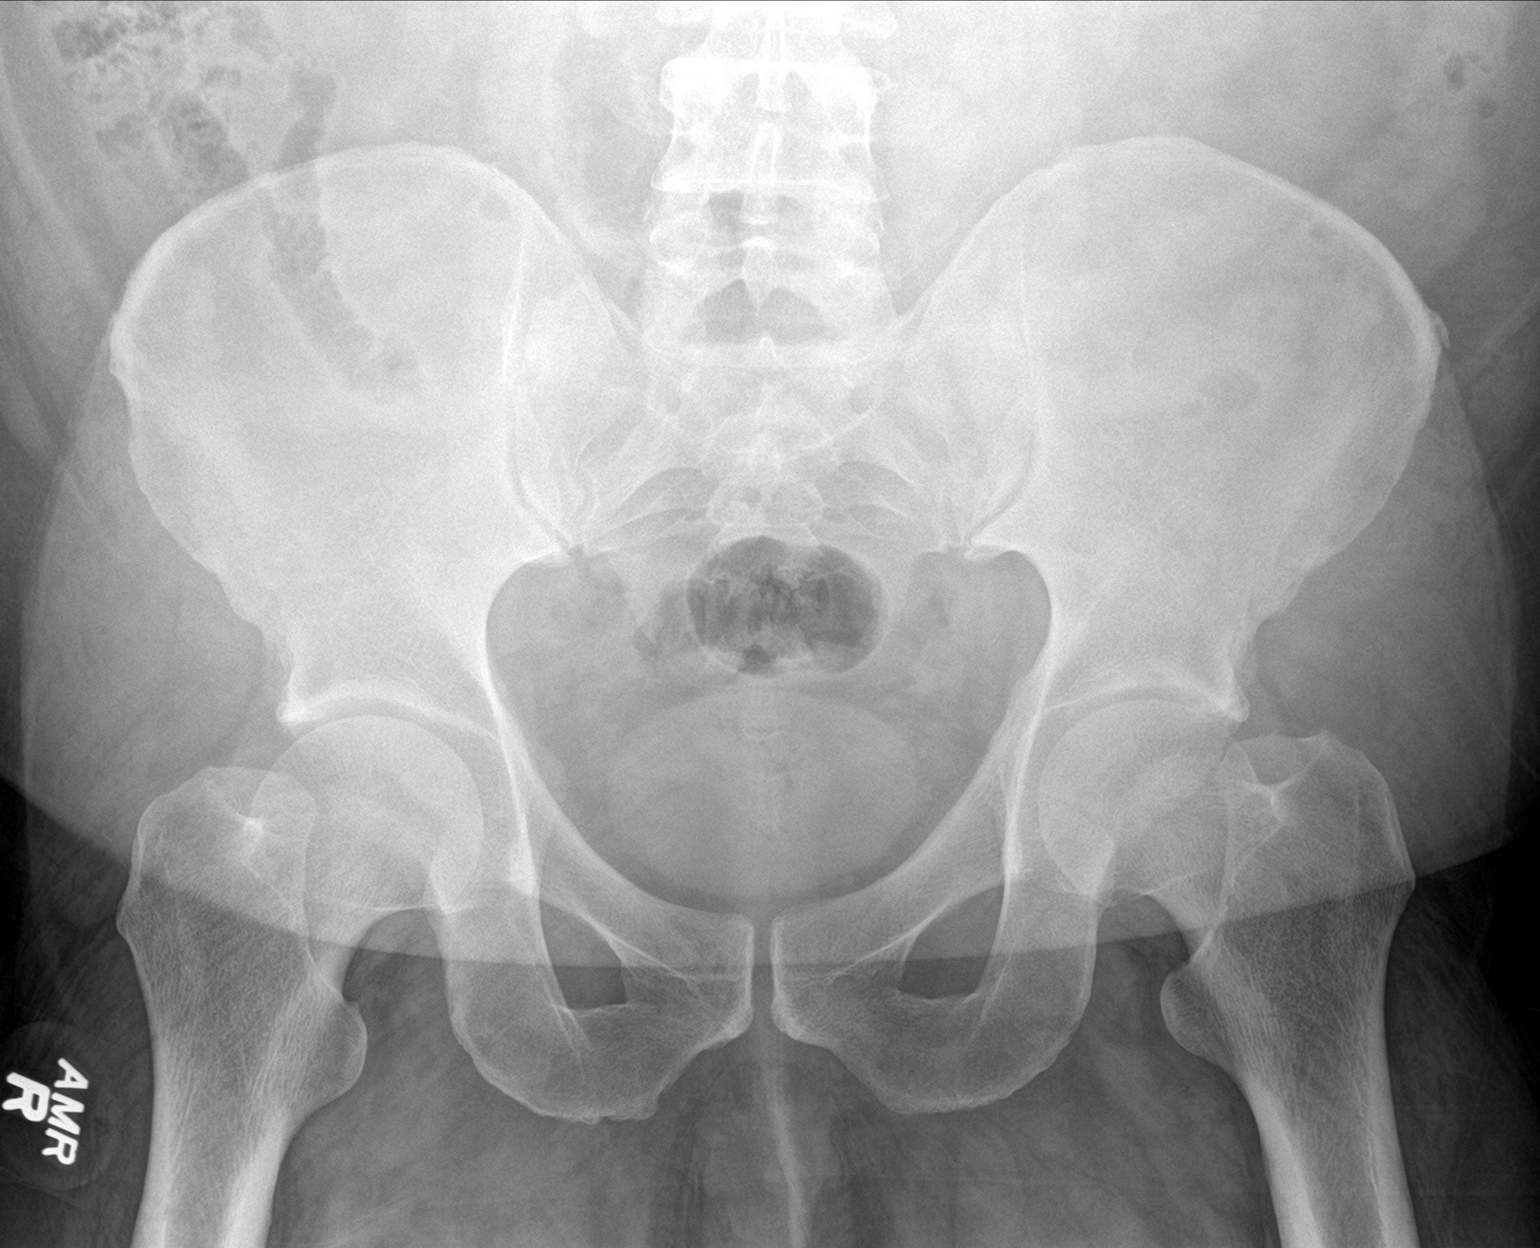

[2 of 2 positions shown; findings below may reference images not displayed]

FINDINGS: The bowel gas pattern is normal. No radio-opaque calculi or other
significant radiographic abnormality are seen.
IMPRESSION: Nonobstructive pattern of bowel gas. No free air in the abdomen on
supine radiographs.

## 2022-03-28 ENCOUNTER — Ambulatory Visit: Payer: Self-pay | Admitting: Physician Assistant

## 2022-04-04 ENCOUNTER — Ambulatory Visit: Payer: Self-pay | Admitting: Physician Assistant

## 2022-04-09 ENCOUNTER — Ambulatory Visit: Payer: Self-pay | Admitting: Physician Assistant

## 2022-04-09 ENCOUNTER — Encounter: Payer: Self-pay | Admitting: Physician Assistant

## 2022-04-09 VITALS — BP 134/88 | HR 65 | Temp 97.9°F | Wt 270.0 lb

## 2022-04-09 DIAGNOSIS — K603 Anal fistula: Secondary | ICD-10-CM

## 2022-04-09 DIAGNOSIS — Z131 Encounter for screening for diabetes mellitus: Secondary | ICD-10-CM

## 2022-04-09 DIAGNOSIS — H6122 Impacted cerumen, left ear: Secondary | ICD-10-CM

## 2022-04-09 DIAGNOSIS — K5900 Constipation, unspecified: Secondary | ICD-10-CM

## 2022-04-09 DIAGNOSIS — Z0000 Encounter for general adult medical examination without abnormal findings: Secondary | ICD-10-CM

## 2022-04-09 DIAGNOSIS — Z87898 Personal history of other specified conditions: Secondary | ICD-10-CM

## 2022-04-09 DIAGNOSIS — E669 Obesity, unspecified: Secondary | ICD-10-CM

## 2022-04-09 DIAGNOSIS — Z13220 Encounter for screening for lipoid disorders: Secondary | ICD-10-CM

## 2022-04-09 NOTE — Patient Instructions (Signed)
Contenido de [place] de los alimentos [employer] La fibra es una sustancia que se Firefighter en los alimentos vegetales, como las frutas, las verduras, los cereales [place], los frutos secos, las semillas y los frijoles. Como parte de su plan de tratamiento y Development worker, international aid, el mdico puede recomendarle que consuma alimentos que contengan cantidades especficas de [place]. Algunas afecciones pueden requerir [place] dieta con alto contenido de [place], y [place], [place] dieta con bajo contenido de fibra. Aqu se le proporciona informacin sobre el contenido de fibra dietaria de algunos alimentos comunes. El mdico le dir la cantidad de fibra que necesita incorporar en su dieta. Comunquese con el mdico o el nutricionista si tiene problemas o preguntas. Qu alimentos son ricos en fibra?  Frutas Arndanos o frambuesas (frescos):  taza (75 g) tiene 4 g de fibra. Pera (fresca): 1 mediana (180 g) tiene 5.5 g de fibra. Ciruelas (secas): 6 a 8 unidades (57 a 76 g) tienen 5 g de fibra. Manzana con piel: 1 mediana (182 g) tiene 4.8 g de fibra. [place]: 1 taza (128 g) tiene 8.9 g de fibra. Verduras Guisantes (congelados):  taza (80 g) tiene 4.4 g de fibra. Papa con piel (al horno): 1 mediana (173 g) tiene 4.4 g de fibra. [place] (enlatada):  taza (122 g) tiene 5 g de fibra. Repollitos de Bruselas (cocidos):  taza (78 g) contiene 4 g de fibra. Batata:  taza en pur (124 g) tiene 4 g de fibra. Calabaza: 1 taza cocida (205 g) tiene 5.7 g de fibra. Granos Cereal de salvado:  taza (31 g) tiene 8.6 g de fibra. Trigo burgol (cocido):  taza (70 g) tiene 4 g de fibra. Quinua (cocida): 1 taza (185 g) tiene 5.2 g de fibra. Palomitas de maz: 3 tazas (375 g) de palomitas tienen 5.8 g de fibra. Fideos espagueti de trigo integral: 1 taza (140 g) tiene 6 g de fibra. Carnes y otras protenas Frijoles pintos (cocidos):  taza (90 g) tiene 7.7 g de fibra. Lentejas (cocidas):  taza (90 g) tiene 7.8 g de  fibra. Frijoles colorados (en lata):  taza (92.5 g) tiene 5.7 g de fibra. Porotos de soja (en lata, congelados o frescos):  taza (92.5 g) tiene 5.2 g de fibra. Frijoles cocidos, comunes o vegetarianos (en lata),  taza (130 g) tiene 5.2 g de fibra. Garbanzos (enlatados):  taza (90 g) tiene 6.6 g de fibra. Frijoles negros (cocidos):  taza (86 g) tiene 7.5 g de fibra. Frijoles blancos o frijoles azules (cocinados): 1?2 taza (91 g) tiene 9.3 g de fibra. Es posible que los productos enumerados anteriormente no sean una lista completa de los alimentos con alto contenido de [place]. Las cantidades reales de fibra pueden ser diferentes en funcin del procesamiento. Consulte a un nutricionista para obtener ms informacin. Qu alimentos son moderados en fibra?  Frutas Banana: 1 mediana (126 g) tiene 3.2 g de fibra. Meln: 1 taza (155 g) tiene 1.4 g de fibra. Naranja: 1 pequea (154 g) tiene 3.7 g de fibra. Pasas:  taza (40 g) tiene 1.8 g de fibra. Pur de manzana (endulzado):  taza (125 g) tiene 1.5 g de fibra. Arndanos (frescos):  taza (75 g) tiene 1.8 g de fibra. Hughie Closs (frescas, en rebanadas): 1 taza (150 g) tiene 3 g de fibra. Cerezas: 1 taza (140 g) tiene 2.9 g de fibra. Verduras Brcoli (cocido):  taza (77.5 g) tiene 2.1 g de fibra. Zanahorias (cocidas):  taza (77.5 g) tiene 2.2 g de fibra. Maz (en lata  o congelado):  taza (82.5 g) tiene 2.1 g de fibra. Pur de papas:  taza (105 g) tiene 1.6 g de fibra. Tomate: 1 mediano (62 g) tiene 1.5 g de fibra. Judas verdes (en lata):  taza (83 g) tiene 2 g de fibra. Zapallo:  taza (58 g) tiene 1 g de fibra. Batata (al horno): 1 mediana (150 g) tiene 3 g de fibra. Coliflor (cocido):  taza (90 g) tiene 2.3 g de fibra. Granos Arroz integral de Set designer (cocido): 1 taza (196 g) tiene 3.5 g de fibra. Bagel solo: un bagel de 4 pulgadas (10 cm) tiene 2 g de fibra. Avena instantnea:  taza (120 g) tiene aproximadamente 2 g de  fibra. Macarrones enriquecidos (cocidos): 1 taza (140 g) tiene 2.5 g de fibra. Multicereales:  taza (15 g) tiene aproximadamente de 2 a 4 g de fibra. Pan de trigo integral: 1 rebanada (26 g) tiene 2 g de fibra. Fideos espagueti de trigo integral:  taza (70 g) tiene 3.2 g de fibra. Tortilla de maz: una tortilla de 6 pulgadas (15 cm) tiene 1.5 g de fibra. Carnes y otras protenas Almendras:  taza o 1 onza (28 g) tiene 3.5 g de fibra. Semillas de girasol, con cscara:  taza o  onza (11.5 g) tiene 1.1 g de fibra. [place] de vegetales o de soja: 1 unidad (70 g) tiene 3.4 g de fibra. Nueces:  taza o 1 onza (30 g) tiene 2 g de fibra. Semilla de lino: 1 cucharada (7 g) tiene 2.8 g de fibra. Es posible que los productos que se enumeran ms [place] no sean una lista completa de los alimentos que contienen cantidades moderadas de [place]. Las cantidades reales de fibra pueden ser diferentes en funcin del procesamiento. Consulte a un nutricionista para obtener ms informacin. Qu alimentos tienen bajo contenido de [place]?  Los alimentos con bajo contenido de fibra tienen menos de 1 g de fibra por porcin. Incluyen los siguientes: Frutas Zumo de frutas: 1?2 taza o 4 onzas (118 ml) tiene 0.5 g de fibra. Verduras Valeda Malm: 1 taza (35 g) tiene 0.5 g de fibra. Pepino (en rebanadas) -- 1?2 taza (60 g) tiene 0.3 g de fibra. Apio: 1 tallo (40 g) tiene 0.1 g de fibra. Granos Tortilla de maz: una tortilla de 6 pulgadas (15 cm) tiene 0.5 g de fibra. Arroz blanco (cocido):  taza (81.5 g) tiene 0.3 g de fibra. Carnes y otras protenas Huevo: 1 grande (50 g) tiene 0 g de [place]. Carne, aves de corral o pescado: 3 onzas (85 g) tienen 0 g de fibra. Lcteos Zumo de frutas: 1 taza u 8 onzas (237 ml) tiene 0 g de fibra. Yogur: 1 taza (245 g) tiene 0 g de fibra. Es posible que los productos enumerados anteriormente no sean una lista completa de los alimentos con bajo contenido de [place]. Las cantidades  reales de fibra pueden ser diferentes en funcin del procesamiento. Consulte a un nutricionista para obtener ms informacin. Resumen La fibra es una sustancia que se encuentra en los alimentos vegetales, como las frutas, las verduras, los cereales [place], los frutos secos, las semillas y los frijoles. Como parte de su plan de tratamiento y Development worker, international aid, el mdico puede recomendarle que consuma alimentos que contengan cantidades especficas de [place]. Esta informacin no tiene Marine scientist el consejo del mdico. Asegrese de hacerle al mdico cualquier pregunta que tenga. Document Revised: 12/17/2019 Document Reviewed: 12/17/2019 Elsevier Patient Education  [place].

## 2022-04-09 NOTE — Progress Notes (Unsigned)
   BP 134/88   Pulse 65   Temp 97.9 F (36.6 C)   Wt 270 lb (122.5 kg)   SpO2 97%   BMI 39.87 kg/m    Subjective:    Patient ID: Christopher Compton, male    DOB: [birth year], 46 y.o.   MRN: [id number]  HPI: [name] is a 46 y.o. male presenting on 04/09/2022 for Annual Exam   HPI  Still has constipation  Using metamucil Drinks 4 bottles/water/day  Fistual is still there.  Sometimes it bleeds     Relevant past medical, surgical, family and social history reviewed and updated as indicated. Interim medical history since our last visit reviewed. Allergies and medications reviewed and updated.   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

## 2022-04-15 ENCOUNTER — Other Ambulatory Visit (HOSPITAL_COMMUNITY)
Admission: RE | Admit: 2022-04-15 | Discharge: 2022-04-15 | Disposition: A | Discharge status: Home / Self Care | Payer: Self-pay | Source: Ambulatory Visit | Attending: Physician Assistant | Admitting: Physician Assistant

## 2022-04-15 DIAGNOSIS — Z131 Encounter for screening for diabetes mellitus: Secondary | ICD-10-CM | POA: Insufficient documentation

## 2022-04-15 DIAGNOSIS — Z13220 Encounter for screening for lipoid disorders: Secondary | ICD-10-CM | POA: Insufficient documentation

## 2022-04-15 DIAGNOSIS — Z87898 Personal history of other specified conditions: Secondary | ICD-10-CM | POA: Insufficient documentation

## 2022-04-15 LAB — COMPREHENSIVE METABOLIC PANEL
ALT: 55 U/L — ABNORMAL HIGH (ref 0–44)
AST: 33 U/L (ref 15–41)
Albumin: 3.8 g/dL (ref 3.5–5.0)
Alkaline Phosphatase: 99 U/L (ref 38–126)
Anion gap: 6 (ref 5–15)
BUN: 14 mg/dL (ref 6–20)
CO2: 27 mmol/L (ref 22–32)
Calcium: 9.2 mg/dL (ref 8.9–10.3)
Chloride: 106 mmol/L (ref 98–111)
Creatinine, Ser: 0.84 mg/dL (ref 0.61–1.24)
GFR, Estimated: >60 mL/min (ref >60)
Glucose, Bld: 197 mg/dL — ABNORMAL HIGH (ref 70–99)
Potassium: 4.3 mmol/L (ref 3.5–5.1)
Sodium: 139 mmol/L (ref 135–145)
Total Bilirubin: 0.7 mg/dL (ref 0.3–1.2)
Total Protein: 6.9 g/dL (ref 6.5–8.1)

## 2022-04-15 LAB — LIPID PANEL
Cholesterol: 155 mg/dL (ref 0–200)
HDL: 47 mg/dL (ref >40)
LDL Cholesterol: 90 mg/dL (ref 0–99)
Total CHOL/HDL Ratio: 3.3 RATIO
Triglycerides: 90 mg/dL (ref <150)
VLDL: 18 mg/dL (ref 0–40)

## 2022-04-15 LAB — HEMOGLOBIN A1C
Hgb A1c MFr Bld: 9.8 % — ABNORMAL HIGH (ref 4.8–5.6)
Mean Plasma Glucose: 234.56 mg/dL

## 2022-04-22 ENCOUNTER — Ambulatory Visit: Payer: Self-pay | Admitting: Physician Assistant

## 2022-04-22 ENCOUNTER — Encounter: Payer: Self-pay | Admitting: Physician Assistant

## 2022-04-22 VITALS — BP 136/86 | HR 77 | Temp 97.8°F | Wt 269.0 lb

## 2022-04-22 DIAGNOSIS — E669 Obesity, unspecified: Secondary | ICD-10-CM

## 2022-04-22 DIAGNOSIS — E119 Type 2 diabetes mellitus without complications: Secondary | ICD-10-CM

## 2022-04-22 MED ORDER — METFORMIN HCL 500 MG PO TABS: 500.0000 mg | ORAL_TABLET | Freq: Two times a day (BID) | ORAL | 1 refills | Status: DC

## 2022-04-22 MED ORDER — LOSARTAN POTASSIUM 25 MG PO TABS: 25.0000 mg | ORAL_TABLET | Freq: Every day | ORAL | 1 refills | Status: DC

## 2022-04-22 MED ORDER — ATORVASTATIN CALCIUM 20 MG PO TABS: 20.0000 mg | ORAL_TABLET | Freq: Every day | ORAL | 1 refills | Status: DC

## 2022-04-22 NOTE — Patient Instructions (Signed)
Diabetes mellitus y nutricin, en adultos Diabetes Mellitus and Nutrition, Adult Si sufre de diabetes, o diabetes mellitus, es muy importante tener hbitos alimenticios saludables debido a que sus niveles de azcar en la sangre (glucosa) se ven afectados en gran medida por lo que come y bebe. Comer alimentos saludables en las cantidades correctas, aproximadamente a la misma hora todos los das, lo ayudar a: Controlar su glucemia. Disminuir el riesgo de sufrir una enfermedad cardaca. Mejorar la presin arterial. Alcanzar o mantener un peso saludable. Qu puede afectar mi plan de alimentacin? Todas las personas que sufren de diabetes son diferentes y cada una tiene necesidades diferentes en cuanto a un plan de alimentacin. El mdico puede recomendarle que trabaje con un nutricionista para elaborar el mejor plan para usted. Su plan de alimentacin puede variar segn factores como: Las caloras que necesita. Los medicamentos que toma. Su peso. Sus niveles de glucemia, presin arterial y colesterol. Su nivel de actividad. Otras afecciones que tenga, como enfermedades cardacas o renales. Cmo me afectan los carbohidratos? Los carbohidratos, o hidratos de carbono, afectan su nivel de glucemia ms que cualquier otro tipo de alimento. La ingesta de carbohidratos aumenta la cantidad de glucosa en la sangre. Es importante conocer la cantidad de carbohidratos que se pueden ingerir en cada comida sin correr ningn riesgo. Esto es diferente en cada persona. Su nutricionista puede ayudarlo a calcular la cantidad de carbohidratos que debe ingerir en cada comida y en cada refrigerio. Cmo me afecta el alcohol? El alcohol puede provocar una disminucin de la glucemia (hipoglucemia), especialmente si usa insulina o toma determinados medicamentos por va oral para la diabetes. La hipoglucemia es una afeccin potencialmente mortal. Los sntomas de la hipoglucemia, como somnolencia, mareos y confusin, son  similares a los sntomas de haber consumido demasiado alcohol. No beba alcohol si: Su mdico le indica no hacerlo. Est embarazada, puede estar embarazada o est tratando de quedar embarazada. Si bebe alcohol: Limite la cantidad que bebe a lo siguiente: De 0 a 1 medida por da para las mujeres. De 0 a 2 medidas por da para los hombres. Sepa cunta cantidad de alcohol hay en las bebidas que toma. En los [place], una medida equivale a una botella de cerveza de 12 oz (355 ml), un vaso de vino de 5 oz (148 ml) o un vaso de una bebida alcohlica de alta graduacin de 1 oz (44 ml). Mantngase hidratado bebiendo agua, refrescos dietticos o t helado sin azcar. Tenga en cuenta que los refrescos comunes, los jugos y otras bebidas para mezclar pueden contener mucha azcar y se deben contar como carbohidratos. Consejos para seguir este plan  Leer las etiquetas de los alimentos Comience por leer el tamao de la porcin en la etiqueta de Informacin nutricional de los alimentos envasados y las bebidas. La cantidad de caloras, carbohidratos, grasas y otros nutrientes detallados en la etiqueta se basan en una porcin del alimento. Muchos alimentos contienen ms de una porcin por envase. Verifique la cantidad total de gramos (g) de carbohidratos totales en una porcin. Verifique la cantidad de gramos de grasas saturadas y grasas trans en una porcin. Escoja alimentos que no contengan estas grasas o que su contenido de estas sea bajo. Verifique la cantidad de miligramos (mg) de sal (sodio) en una porcin. La mayora de las personas deben limitar la ingesta de sodio total a menos de 2300 mg por da. Siempre consulte la informacin nutricional de los alimentos etiquetados como "con bajo contenido de grasa" o "sin grasa".   Estos alimentos pueden tener un mayor contenido de azcar agregada o carbohidratos refinados, y deben evitarse. Hable con su nutricionista para identificar sus objetivos diarios en  cuanto a los nutrientes mencionados en la etiqueta. Al ir de compras Evite comprar alimentos procesados, enlatados o precocidos. Estos alimentos tienden a tener una mayor cantidad de grasa, sodio y azcar agregada. Compre en la zona exterior de la tienda de comestibles. Esta es la zona donde se encuentran con mayor frecuencia las frutas y las verduras frescas, los cereales a granel, las carnes frescas y los productos lcteos frescos. Al cocinar Use mtodos de coccin a baja temperatura, como hornear, en lugar de mtodos de coccin a alta temperatura, como frer en abundante aceite. Cocine con aceites saludables, como el aceite de oliva, canola o girasol. Evite cocinar con manteca, crema o carnes con alto contenido de grasa. Planificacin de las comidas Coma las comidas y los refrigerios regularmente, preferentemente a la misma hora todos los das. Evite pasar largos perodos de tiempo sin comer. Consuma alimentos ricos en fibra, como frutas frescas, verduras, frijoles y cereales integrales. Consuma entre 4 y 6 onzas (entre 112 y 168 g) de protenas magras por da, como carnes magras, pollo, pescado, huevos o tofu. Una onza (oz) (28 g) de protena magra equivale a: 1 onza (28 g) de carne, pollo o pescado. 1 huevo.  taza (62 g) de tofu. Coma algunos alimentos por da que contengan grasas saludables, como aguacates, frutos secos, semillas y pescado. Qu alimentos debo comer? Frutas Bayas. Manzanas. Naranjas. Duraznos. Damascos. Ciruelas. Uvas. Mangos. Papayas. Granadas. Kiwi. Cerezas. Verduras Verduras de hoja verde, que incluyen lechuga, espinaca, col rizada, acelga, hojas de berza, hojas de mostaza y repollo. Remolachas. Coliflor. Brcoli. Zanahorias. Judas verdes. Tomates. Pimientos. Cebollas. Pepinos. Coles de Bruselas. Granos Granos integrales, como panes, galletas, tortillas, cereales y pastas de salvado o integrales. Avena sin azcar. Quinua. Arroz integral o salvaje. Carnes y otras  protenas Frutos de mar. Carne de ave sin piel. Cortes magros de ave y carne de res. Tofu. Frutos secos. Semillas. Lcteos Productos lcteos sin grasa o con bajo contenido de grasa, como leche, yogur y queso. Es posible que los productos detallados arriba no constituyan una lista completa de los alimentos y las bebidas que puede tomar. Consulte a un nutricionista para obtener ms informacin. Qu alimentos debo evitar? Frutas Frutas enlatadas al almbar. Verduras Verduras enlatadas. Verduras congeladas con mantequilla o salsa de crema. Granos Productos elaborados con harina y harina blanca refinada, como panes, pastas, bocadillos y cereales. Evite todos los alimentos procesados. Carnes y otras protenas Cortes de carne con alto contenido de grasa. Carne de ave con piel. Carnes empanizadas o fritas. Carne procesada. Evite las grasas saturadas. Lcteos Yogur, queso o leche enteros. Bebidas Bebidas azucaradas, como gaseosas o t helado. Es posible que los productos que se enumeran ms arriba no constituyan una lista completa de los alimentos y las bebidas que debe evitar. Consulte a un nutricionista para obtener ms informacin. Preguntas para hacerle al mdico Debo consultar con un especialista certificado en atencin y educacin sobre la diabetes? Es necesario que me rena con un nutricionista? A qu nmero puedo llamar si tengo preguntas? Cules son los mejores momentos para controlar la glucemia? Dnde encontrar ms informacin: American Diabetes Association (Asociacin Estadounidense de la Diabetes): diabetes.org Academy of Nutrition and Dietetics (Academia de Nutricin y Diettica): eatright.org National Institute of Diabetes and Digestive and Kidney Diseases (Instituto Nacional de la Diabetes y las Enfermedades Digestivas y Renales): niddk.nih.gov Association of Diabetes   Care & Education Specialists (Asociacin de Especialistas en Atencin y Educacin sobre la Diabetes):  diabeteseducator.org Resumen Es importante tener hbitos alimenticios saludables debido a que sus niveles de azcar en la sangre (glucosa) se ven afectados en gran medida por lo que come y bebe. Es importante consumir alcohol con prudencia. Un plan de comidas saludable lo ayudar a controlar la glucosa en sangre y a reducir el riesgo de enfermedades cardacas. El mdico puede recomendarle que trabaje con un nutricionista para elaborar el mejor plan para usted. Esta informacin no tiene como fin reemplazar el consejo del mdico. Asegrese de hacerle al mdico cualquier pregunta que tenga. Document Revised: 02/16/2020 Document Reviewed: 02/16/2020 Elsevier Patient Education  2023 Elsevier Inc.  

## 2022-04-22 NOTE — Progress Notes (Signed)
BP 136/86   Pulse 77   Temp 97.8 F (36.6 C)   Wt 269 lb (122 kg)   SpO2 95%   BMI 39.72 kg/m    Subjective:    Patient ID: Christopher Compton, male    DOB: June 30, 1975, 46 y.o.   MRN: [id number]  HPI: Christopher Compton is a 46 y.o. male presenting on 04/22/2022 for review labs   HPI   Pt is 42yoM who is in today to review labs.  He was here for his annual well check earlier this month.  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  He feels well.    Relevant past medical, surgical, family and social history reviewed and updated as indicated. Interim medical history since our last visit reviewed. Allergies and medications reviewed and updated.    Current Outpatient Medications:    psyllium (METAMUCIL) 58.6 % powder, Take 1 packet by mouth 2 (two) times daily., Disp: , Rfl:    Review of Systems  Per HPI unless specifically indicated above     Objective:    BP 136/86   Pulse 77   Temp 97.8 F (36.6 C)   Wt 269 lb (122 kg)   SpO2 95%   BMI 39.72 kg/m   Wt Readings from Last 3 Encounters:  04/22/22 269 lb (122 kg)  04/09/22 270 lb (122.5 kg)  03/26/21 270 lb (122.5 kg)    Physical Exam Constitutional:      General: He is not in acute distress.    Appearance: He is obese. He is not toxic-appearing.  HENT:     Head: Normocephalic and atraumatic.  Pulmonary:     Effort: Pulmonary effort is normal. No respiratory distress.  Neurological:     Mental Status: He is alert and oriented to person, place, and time.  Psychiatric:        Mood and Affect: Mood normal.        Behavior: Behavior normal.     Results for orders placed or performed during the hospital encounter of 04/15/22  Hemoglobin A1c  Result Value Ref Range   Hgb A1c MFr Bld 9.8 (H) 4.8 - 5.6 %   Mean Plasma Glucose 234.56 mg/dL  Lipid panel  Result Value Ref Range   Cholesterol 155 0 - 200 mg/dL   Triglycerides 90 <150 mg/dL   HDL 47 >40 mg/dL   Total CHOL/HDL Ratio 3.3 RATIO   VLDL 18 0 - 40 mg/dL    LDL Cholesterol 90 0 - 99 mg/dL  Comprehensive metabolic panel  Result Value Ref Range   Sodium 139 135 - 145 mmol/L   Potassium 4.3 3.5 - 5.1 mmol/L   Chloride 106 98 - 111 mmol/L   CO2 27 22 - 32 mmol/L   Glucose, Bld 197 (H) 70 - 99 mg/dL   BUN 14 6 - 20 mg/dL   Creatinine, Ser 0.84 0.61 - 1.24 mg/dL   Calcium 9.2 8.9 - 10.3 mg/dL   Total Protein 6.9 6.5 - 8.1 g/dL   Albumin 3.8 3.5 - 5.0 g/dL   AST 33 15 - 41 U/L   ALT 55 (H) 0 - 44 U/L   Alkaline Phosphatase 99 38 - 126 U/L   Total Bilirubin 0.7 0.3 - 1.2 mg/dL   GFR, Estimated >60 >60 mL/min   Anion gap 6 5 - 15      Assessment & Plan:    Encounter Diagnoses  Name Primary?   Diabetes mellitus without complication (HCC) Yes   Obesity, unspecified  classification, unspecified obesity type, unspecified whether serious comorbidity present      -reviewed labs with pt -discussed and counseled on how diabetes can affect the entire body.  Discussed need to treat with meds; rx sent for metformin, atorvastatin and losartan. -pt is referred for DM eye exam -pt was counseled on Diet and exercise and Wt mgt -pt to followup 3 months.  He is to contact office sooner prn

## 2022-05-07 ENCOUNTER — Ambulatory Visit (INDEPENDENT_AMBULATORY_CARE_PROVIDER_SITE_OTHER): Payer: Self-pay | Admitting: General Surgery

## 2022-05-07 ENCOUNTER — Encounter: Payer: Self-pay | Admitting: General Surgery

## 2022-05-07 VITALS — BP 119/73 | HR 70 | Temp 98.3°F | Resp 16 | Ht 69.0 in | Wt 270.0 lb

## 2022-05-07 DIAGNOSIS — K603 Anal fistula: Secondary | ICD-10-CM

## 2022-05-07 MED ORDER — AMOXICILLIN-POT CLAVULANATE 500-125 MG PO TABS: 1.0000 | ORAL_TABLET | Freq: Three times a day (TID) | ORAL | 0 refills | Status: DC

## 2022-05-07 NOTE — Progress Notes (Signed)
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  I have seen him in the past for this.  It did somewhat resolve with antibiotics.  He states that he notices a groove from the opening towards his anus.  It is nontender.  Intermittent serous drainage is noted.  No stool or air seems to emanate from it.  He is not incontinent.  He denies any fever or chills. Past Medical History:  Diagnosis Date   Abdominal hernia    Diabetes mellitus without complication (HCC)    Controlled by diet   Hypertension     Past Surgical History:  Procedure Laterality Date   UMBILICAL HERNIA REPAIR N/A 02/03/2014   Procedure: UMBILICAL HERNIA REPAIR ADULT;  Surgeon: Christopher Hatcher, MD;  Location: [facility];  Service: General;  Laterality: N/A;    History reviewed. No pertinent family history.  Current Outpatient Medications on File Prior to Visit  Medication Sig Dispense Refill   atorvastatin (LIPITOR) 20 MG tablet Take 1 tablet (20 mg total) by mouth daily. 30 tablet 1   losartan (COZAAR) 25 MG tablet Take 1 tablet (25 mg total) by mouth daily. 30 tablet 1   metFORMIN (GLUCOPHAGE) 500 MG tablet Take 1 tablet (500 mg total) by mouth 2 (two) times daily with a meal. 60 tablet 1   psyllium (METAMUCIL) 58.6 % powder Take 1 packet by mouth 2 (two) times daily.     No current facility-administered medications on file prior to visit.    No Known Allergies  Social History   Substance and Sexual Activity  Alcohol Use No    Social History   Tobacco Use  Smoking Status Never  Smokeless Tobacco Never    Review of Systems  Constitutional: Negative.   HENT: Negative.    Gastrointestinal:  Positive for heartburn.  Genitourinary:  Positive for frequency.  Musculoskeletal:  Positive for back pain.  Skin: Negative.   Neurological: Negative.   Endo/Heme/Allergies: Negative.    Psychiatric/Behavioral: Negative.      Objective   Vitals:   05/07/22 1454  BP: 119/73  Pulse: 70  Resp: 16  Temp: 98.3 F (36.8 C)  SpO2: 96%    Physical Exam Vitals reviewed.  Constitutional:      Appearance: Normal appearance. He is normal weight. He is not ill-appearing.  HENT:     Head: Normocephalic and atraumatic.  Cardiovascular:     Rate and Rhythm: Normal rate and regular rhythm.     Heart sounds: Normal heart sounds. No murmur heard.    No friction rub. No gallop.  Pulmonary:     Effort: Pulmonary effort is normal. No respiratory distress.     Breath sounds: Normal breath sounds. No stridor. No wheezing, rhonchi or rales.  Genitourinary:    Comments: There is a perianal fistula at the 6 o'clock position posteriorly which is just outside the anal verge with serous drainage present.  The fistulous tract goes straight towards the anus and seems to possibly involve the external sphincter.  There is no stool present.  The tract does have a narrow induration present. Skin:    General: Skin is warm and dry.  Neurological:     Mental Status: He is alert and oriented to person, place, and time.    Previous office notes reviewed Assessment  Perianal fistula Plan  Augmentin 500 mg 3 times a  day for the next 10 days.  I am trying to avoid a fistulotomy, though we may end up having to perform that.  I am concerned that this may be traversing the muscle.  Will reevaluate in 3 weeks.

## 2022-05-28 ENCOUNTER — Ambulatory Visit (INDEPENDENT_AMBULATORY_CARE_PROVIDER_SITE_OTHER): Payer: Self-pay | Admitting: General Surgery

## 2022-05-28 ENCOUNTER — Encounter: Payer: Self-pay | Admitting: General Surgery

## 2022-05-28 VITALS — BP 126/78 | HR 67 | Temp 97.6°F | Resp 16 | Ht 69.0 in | Wt 268.0 lb

## 2022-05-28 DIAGNOSIS — K603 Anal fistula: Secondary | ICD-10-CM

## 2022-05-28 NOTE — Progress Notes (Signed)
Subjective:     Christopher Compton  Patient presents for follow-up of his anal fistula.  He states it is much improved and no drainage has been present.  He denies any pain. Objective:    BP 126/78   Pulse 67   Temp 97.6 F (36.4 C) (Oral)   Resp 16   Ht 5\' 9"  (1.753 m)   Wt 268 lb (121.6 kg)   SpO2 96%   BMI 39.58 kg/m   General:  alert, cooperative, and no distress  Rectal examination reveals a healed anal fistula.  No drainage is present.  The previous tunneling subcutaneously towards the anus has almost resolved.     Assessment:    Anal fistula, resolving    Plan:   No need for acute surgical invention at the present time.  I told the patient to return to my care should the fistula flare back up.  He understands and agrees.  Follow-up as needed.

## 2022-06-26 ENCOUNTER — Other Ambulatory Visit: Payer: Self-pay | Admitting: Physician Assistant

## 2022-07-01 ENCOUNTER — Other Ambulatory Visit: Payer: Self-pay | Admitting: Physician Assistant

## 2022-07-01 DIAGNOSIS — E119 Type 2 diabetes mellitus without complications: Secondary | ICD-10-CM

## 2022-07-09 ENCOUNTER — Encounter: Payer: Self-pay | Admitting: Physician Assistant

## 2022-07-16 ENCOUNTER — Other Ambulatory Visit (HOSPITAL_COMMUNITY)
Admission: RE | Admit: 2022-07-16 | Discharge: 2022-07-16 | Disposition: A | Discharge status: Home / Self Care | Payer: Self-pay | Source: Ambulatory Visit | Attending: Physician Assistant | Admitting: Physician Assistant

## 2022-07-16 DIAGNOSIS — E119 Type 2 diabetes mellitus without complications: Secondary | ICD-10-CM | POA: Insufficient documentation

## 2022-07-16 LAB — LIPID PANEL
Cholesterol: 137 mg/dL (ref 0–200)
HDL: 46 mg/dL (ref >40)
LDL Cholesterol: 71 mg/dL (ref 0–99)
Total CHOL/HDL Ratio: 3 RATIO
Triglycerides: 99 mg/dL (ref <150)
VLDL: 20 mg/dL (ref 0–40)

## 2022-07-16 LAB — COMPREHENSIVE METABOLIC PANEL
ALT: 31 U/L (ref 0–44)
AST: 25 U/L (ref 15–41)
Albumin: 4.3 g/dL (ref 3.5–5.0)
Alkaline Phosphatase: 81 U/L (ref 38–126)
Anion gap: 9 (ref 5–15)
BUN: 16 mg/dL (ref 6–20)
CO2: 29 mmol/L (ref 22–32)
Calcium: 9.3 mg/dL (ref 8.9–10.3)
Chloride: 101 mmol/L (ref 98–111)
Creatinine, Ser: 0.83 mg/dL (ref 0.61–1.24)
GFR, Estimated: >60 mL/min (ref >60)
Glucose, Bld: 141 mg/dL — ABNORMAL HIGH (ref 70–99)
Potassium: 4.3 mmol/L (ref 3.5–5.1)
Sodium: 139 mmol/L (ref 135–145)
Total Bilirubin: 1 mg/dL (ref 0.3–1.2)
Total Protein: 7.4 g/dL (ref 6.5–8.1)

## 2022-07-16 LAB — HEMOGLOBIN A1C
Hgb A1c MFr Bld: 8.2 % — ABNORMAL HIGH (ref 4.8–5.6)
Mean Plasma Glucose: 188.64 mg/dL

## 2022-07-17 LAB — MICROALBUMIN, URINE: Microalb, Ur: 18.5 ug/mL — ABNORMAL HIGH

## 2022-07-22 ENCOUNTER — Encounter: Payer: Self-pay | Admitting: Physician Assistant

## 2022-07-22 ENCOUNTER — Ambulatory Visit: Payer: Self-pay | Admitting: Physician Assistant

## 2022-07-22 VITALS — BP 126/83 | HR 67 | Temp 96.5°F | Wt 265.0 lb

## 2022-07-22 DIAGNOSIS — E669 Obesity, unspecified: Secondary | ICD-10-CM

## 2022-07-22 DIAGNOSIS — E119 Type 2 diabetes mellitus without complications: Secondary | ICD-10-CM

## 2022-07-22 DIAGNOSIS — B353 Tinea pedis: Secondary | ICD-10-CM

## 2022-07-22 MED ORDER — METFORMIN HCL 1000 MG PO TABS
1000.0000 mg | ORAL_TABLET | Freq: Two times a day (BID) | ORAL | 3 refills | Status: DC
Start: 2022-07-22 — End: 2022-07-23

## 2022-07-22 NOTE — Progress Notes (Signed)
BP 126/83   Pulse 67   Temp (!) 96.5 F (35.8 C)   Wt 265 lb (120.2 kg)   SpO2 95%   BMI 39.13 kg/m    Subjective:    Patient ID: Christopher Compton, male    DOB: 03/24/1976, 47 y.o.   MRN: [id number]  HPI: Keithen Capo is a 47 y.o. male presenting on 07/22/2022 for Diabetes   HPI   Chief Complaint  Patient presents with   Diabetes    He is works mfg  He has been working on watching what he eats.  He went to eye doctor earlier this month.  Pt has no complaints today.    Relevant past medical, surgical, family and social history reviewed and updated as indicated. Interim medical history since our last visit reviewed. Allergies and medications reviewed and updated.   Current Outpatient Medications:    atorvastatin (LIPITOR) 20 MG tablet, TAKE 1 TABLET BY MOUTH EVERY DAY, Disp: 30 tablet, Rfl: 1   losartan (COZAAR) 25 MG tablet, TAKE 1 TABLET BY MOUTH EVERY DAY, Disp: 30 tablet, Rfl: 1   metFORMIN (GLUCOPHAGE) 500 MG tablet, TAKE 1 TABLET BY MOUTH TWICE DAILY WITH MEALS, Disp: 60 tablet, Rfl: 1   psyllium (METAMUCIL) 58.6 % powder, Take 1 packet by mouth 2 (two) times daily., Disp: , Rfl:     Review of Systems  Per HPI unless specifically indicated above     Objective:    BP 126/83   Pulse 67   Temp (!) 96.5 F (35.8 C)   Wt 265 lb (120.2 kg)   SpO2 95%   BMI 39.13 kg/m   Wt Readings from Last 3 Encounters:  07/22/22 265 lb (120.2 kg)  05/28/22 268 lb (121.6 kg)  05/07/22 270 lb (122.5 kg)    Physical Exam Vitals reviewed.  Constitutional:      General: He is not in acute distress.    Appearance: He is well-developed. He is not ill-appearing.  HENT:     Head: Normocephalic and atraumatic.  Cardiovascular:     Rate and Rhythm: Normal rate and regular rhythm.  Pulmonary:     Effort: Pulmonary effort is normal.     Breath sounds: Normal breath sounds. No wheezing.  Abdominal:     General: Bowel sounds are normal.     Palpations:  Abdomen is soft.     Tenderness: There is no abdominal tenderness.  Musculoskeletal:     Cervical back: Neck supple.     Right lower leg: No edema.     Left lower leg: No edema.  Lymphadenopathy:     Cervical: No cervical adenopathy.  Skin:    General: Skin is warm and dry.  Neurological:     Mental Status: He is alert and oriented to person, place, and time.  Psychiatric:        Behavior: Behavior normal.     Results for orders placed or performed during the hospital encounter of 07/16/22  Microalbumin, urine  Result Value Ref Range   Microalb, Ur 18.5 (H) Not Estab. ug/mL  Hemoglobin A1c  Result Value Ref Range   Hgb A1c MFr Bld 8.2 (H) 4.8 - 5.6 %   Mean Plasma Glucose 188.64 mg/dL  Lipid panel  Result Value Ref Range   Cholesterol 137 0 - 200 mg/dL   Triglycerides 99 <150 mg/dL   HDL 46 >40 mg/dL   Total CHOL/HDL Ratio 3.0 RATIO   VLDL 20 0 - 40 mg/dL   LDL Cholesterol  71 0 - 99 mg/dL  Comprehensive metabolic panel  Result Value Ref Range   Sodium 139 135 - 145 mmol/L   Potassium 4.3 3.5 - 5.1 mmol/L   Chloride 101 98 - 111 mmol/L   CO2 29 22 - 32 mmol/L   Glucose, Bld 141 (H) 70 - 99 mg/dL   BUN 16 6 - 20 mg/dL   Creatinine, Ser 0.83 0.61 - 1.24 mg/dL   Calcium 9.3 8.9 - 10.3 mg/dL   Total Protein 7.4 6.5 - 8.1 g/dL   Albumin 4.3 3.5 - 5.0 g/dL   AST 25 15 - 41 U/L   ALT 31 0 - 44 U/L   Alkaline Phosphatase 81 38 - 126 U/L   Total Bilirubin 1.0 0.3 - 1.2 mg/dL   GFR, Estimated >60 >60 mL/min   Anion gap 9 5 - 15      Assessment & Plan:    Encounter Diagnoses  Name Primary?   Diabetes mellitus without complication ([facility]) Yes   Tinea pedis of both feet    Obesity, unspecified classification, unspecified obesity type, unspecified whether serious comorbidity present     -reviewed labs with pt -Increase metformin -Continue atorvastatin and losartan -will Check on medassist for him -Counseled on athletes foot and recommended OTC antifungal cream -pt  to F/u 3 months.  He is to contact office sooner prn

## 2022-07-23 ENCOUNTER — Other Ambulatory Visit: Payer: Self-pay | Admitting: Physician Assistant

## 2022-07-23 MED ORDER — ATORVASTATIN CALCIUM 20 MG PO TABS: 20.0000 mg | ORAL_TABLET | Freq: Every day | ORAL | 0 refills | Status: DC

## 2022-07-23 MED ORDER — LOSARTAN POTASSIUM 25 MG PO TABS: 25.0000 mg | ORAL_TABLET | Freq: Every day | ORAL | 0 refills | Status: DC

## 2022-07-23 MED ORDER — METFORMIN HCL 1000 MG PO TABS: 1000.0000 mg | ORAL_TABLET | Freq: Two times a day (BID) | ORAL | 0 refills | Status: DC

## 2022-09-26 ENCOUNTER — Other Ambulatory Visit: Payer: Self-pay | Admitting: Physician Assistant

## 2022-09-26 DIAGNOSIS — E119 Type 2 diabetes mellitus without complications: Secondary | ICD-10-CM

## 2022-09-26 DIAGNOSIS — E785 Hyperlipidemia, unspecified: Secondary | ICD-10-CM

## 2022-10-17 ENCOUNTER — Other Ambulatory Visit (HOSPITAL_COMMUNITY)
Admission: RE | Admit: 2022-10-17 | Discharge: 2022-10-17 | Disposition: A | Discharge status: Home / Self Care | Payer: Self-pay | Source: Ambulatory Visit | Attending: Physician Assistant | Admitting: Physician Assistant

## 2022-10-17 DIAGNOSIS — E119 Type 2 diabetes mellitus without complications: Secondary | ICD-10-CM | POA: Insufficient documentation

## 2022-10-17 DIAGNOSIS — E785 Hyperlipidemia, unspecified: Secondary | ICD-10-CM | POA: Insufficient documentation

## 2022-10-17 LAB — COMPREHENSIVE METABOLIC PANEL
ALT: 35 U/L (ref 0–44)
AST: 39 U/L (ref 15–41)
Albumin: 4.1 g/dL (ref 3.5–5.0)
Alkaline Phosphatase: 66 U/L (ref 38–126)
Anion gap: 7 (ref 5–15)
BUN: 14 mg/dL (ref 6–20)
CO2: 26 mmol/L (ref 22–32)
Calcium: 8.9 mg/dL (ref 8.9–10.3)
Chloride: 102 mmol/L (ref 98–111)
Creatinine, Ser: 0.92 mg/dL (ref 0.61–1.24)
GFR, Estimated: >60 mL/min (ref >60)
Glucose, Bld: 132 mg/dL — ABNORMAL HIGH (ref 70–99)
Potassium: 4 mmol/L (ref 3.5–5.1)
Sodium: 135 mmol/L (ref 135–145)
Total Bilirubin: 0.8 mg/dL (ref 0.3–1.2)
Total Protein: 6.7 g/dL (ref 6.5–8.1)

## 2022-10-17 LAB — LIPID PANEL
Cholesterol: 113 mg/dL (ref 0–200)
HDL: 46 mg/dL (ref >40)
LDL Cholesterol: 55 mg/dL (ref 0–99)
Total CHOL/HDL Ratio: 2.5 RATIO
Triglycerides: 58 mg/dL (ref <150)
VLDL: 12 mg/dL (ref 0–40)

## 2022-10-17 LAB — HEMOGLOBIN A1C
Hgb A1c MFr Bld: 7.2 % — ABNORMAL HIGH (ref 4.8–5.6)
Mean Plasma Glucose: 159.94 mg/dL

## 2022-10-21 ENCOUNTER — Encounter: Payer: Self-pay | Admitting: Physician Assistant

## 2022-10-21 ENCOUNTER — Ambulatory Visit: Payer: Self-pay | Admitting: Physician Assistant

## 2022-10-21 VITALS — BP 124/75 | HR 85 | Temp 97.9°F | Wt 265.0 lb

## 2022-10-21 DIAGNOSIS — E669 Obesity, unspecified: Secondary | ICD-10-CM

## 2022-10-21 DIAGNOSIS — E119 Type 2 diabetes mellitus without complications: Secondary | ICD-10-CM

## 2022-10-21 DIAGNOSIS — E785 Hyperlipidemia, unspecified: Secondary | ICD-10-CM

## 2022-10-21 MED ORDER — LOSARTAN POTASSIUM 25 MG PO TABS: 25.0000 mg | ORAL_TABLET | Freq: Every day | ORAL | 0 refills | Status: DC

## 2022-10-21 MED ORDER — ATORVASTATIN CALCIUM 20 MG PO TABS: 20.0000 mg | ORAL_TABLET | Freq: Every day | ORAL | 0 refills | Status: DC

## 2022-10-21 MED ORDER — JANUMET 50-500 MG PO TABS: 1.0000 | ORAL_TABLET | Freq: Two times a day (BID) | ORAL | 0 refills | Status: DC

## 2022-10-21 NOTE — Progress Notes (Signed)
BP 124/75   Pulse 85   Temp 97.9 F (36.6 C)   Wt 265 lb (120.2 kg)   SpO2 97%   BMI 39.13 kg/m    Subjective:    Patient ID: Christopher Compton, male    DOB: Jan 29, 1976, 47 y.o.   MRN: [id number]  HPI: Christopher Compton is a 47 y.o. male presenting on 10/21/2022 for Diabetes   HPI  Chief Complaint  Patient presents with   Diabetes    Pt says he is doing well.  He says the Metformin bothering stomach when he takes it bid but seems okay if he only takes it qd.   BMs okay, just feels a little bad in the belly with bid dosing    Relevant past medical, surgical, family and social history reviewed and updated as indicated. Interim medical history since our last visit reviewed. Allergies and medications reviewed and updated.   Current Outpatient Medications:    atorvastatin (LIPITOR) 20 MG tablet, Take 1 tablet (20 mg total) by mouth daily., Disp: 90 tablet, Rfl: 0   losartan (COZAAR) 25 MG tablet, Take 1 tablet (25 mg total) by mouth daily., Disp: 90 tablet, Rfl: 0   metFORMIN (GLUCOPHAGE) 1000 MG tablet, Take 1 tablet (1,000 mg total) by mouth 2 (two) times daily with a meal., Disp: 180 tablet, Rfl: 0   psyllium (METAMUCIL) 58.6 % powder, Take 1 packet by mouth 2 (two) times daily., Disp: , Rfl:     Review of Systems  Per HPI unless specifically indicated above     Objective:    BP 124/75   Pulse 85   Temp 97.9 F (36.6 C)   Wt 265 lb (120.2 kg)   SpO2 97%   BMI 39.13 kg/m   Wt Readings from Last 3 Encounters:  10/21/22 265 lb (120.2 kg)  07/22/22 265 lb (120.2 kg)  05/28/22 268 lb (121.6 kg)    Physical Exam Vitals reviewed.  Constitutional:      General: He is not in acute distress.    Appearance: He is well-developed. He is obese. He is not toxic-appearing.  HENT:     Head: Normocephalic and atraumatic.  Cardiovascular:     Rate and Rhythm: Normal rate and regular rhythm.  Pulmonary:     Effort: Pulmonary effort is normal.     Breath sounds:  Normal breath sounds. No wheezing.  Abdominal:     General: Bowel sounds are normal.     Palpations: Abdomen is soft.     Tenderness: There is no abdominal tenderness.  Musculoskeletal:     Cervical back: Neck supple.     Right lower leg: No edema.     Left lower leg: No edema.  Lymphadenopathy:     Cervical: No cervical adenopathy.  Skin:    General: Skin is warm and dry.  Neurological:     Mental Status: He is alert and oriented to person, place, and time.  Psychiatric:        Behavior: Behavior normal.     Results for orders placed or performed during the hospital encounter of 10/17/22  Hemoglobin A1c  Result Value Ref Range   Hgb A1c MFr Bld 7.2 (H) 4.8 - 5.6 %   Mean Plasma Glucose 159.94 mg/dL  Lipid panel  Result Value Ref Range   Cholesterol 113 0 - 200 mg/dL   Triglycerides 58 <782 mg/dL   HDL 46 >95 mg/dL   Total CHOL/HDL Ratio 2.5 RATIO   VLDL 12 0 -  40 mg/dL   LDL Cholesterol 55 0 - 99 mg/dL  Comprehensive metabolic panel  Result Value Ref Range   Sodium 135 135 - 145 mmol/L   Potassium 4.0 3.5 - 5.1 mmol/L   Chloride 102 98 - 111 mmol/L   CO2 26 22 - 32 mmol/L   Glucose, Bld 132 (H) 70 - 99 mg/dL   BUN 14 6 - 20 mg/dL   Creatinine, Ser 1.47 0.61 - 1.24 mg/dL   Calcium 8.9 8.9 - 82.9 mg/dL   Total Protein 6.7 6.5 - 8.1 g/dL   Albumin 4.1 3.5 - 5.0 g/dL   AST 39 15 - 41 U/L   ALT 35 0 - 44 U/L   Alkaline Phosphatase 66 38 - 126 U/L   Total Bilirubin 0.8 0.3 - 1.2 mg/dL   GFR, Estimated >56 >21 mL/min   Anion gap 7 5 - 15      Assessment & Plan:   Encounter Diagnoses  Name Primary?   Diabetes mellitus without complication (HCC) Yes   Hyperlipidemia, unspecified hyperlipidemia type    Obesity, unspecified classification, unspecified obesity type, unspecified whether serious comorbidity present       -reviewed labs with pt -Change meftofmin 1g to janumet 50/500 bid -pt to continue atorvastatin and losartan -pt counseled frequent smaller meals  better than larger meals less often -pt to follow up 3 months.  He is to contact office sooner prn

## 2023-01-01 ENCOUNTER — Other Ambulatory Visit: Payer: Self-pay | Admitting: Physician Assistant

## 2023-01-01 DIAGNOSIS — E119 Type 2 diabetes mellitus without complications: Secondary | ICD-10-CM

## 2023-01-01 DIAGNOSIS — E785 Hyperlipidemia, unspecified: Secondary | ICD-10-CM

## 2023-01-17 ENCOUNTER — Other Ambulatory Visit (HOSPITAL_COMMUNITY)
Admission: RE | Admit: 2023-01-17 | Discharge: 2023-01-17 | Disposition: A | Discharge status: Home / Self Care | Payer: Self-pay | Source: Ambulatory Visit | Attending: Physician Assistant | Admitting: Physician Assistant

## 2023-01-17 DIAGNOSIS — E119 Type 2 diabetes mellitus without complications: Secondary | ICD-10-CM | POA: Insufficient documentation

## 2023-01-17 DIAGNOSIS — E785 Hyperlipidemia, unspecified: Secondary | ICD-10-CM | POA: Insufficient documentation

## 2023-01-17 LAB — COMPREHENSIVE METABOLIC PANEL
ALT: 27 U/L (ref 0–44)
AST: 25 U/L (ref 15–41)
Albumin: 4 g/dL (ref 3.5–5.0)
Alkaline Phosphatase: 77 U/L (ref 38–126)
Anion gap: 9 (ref 5–15)
BUN: 15 mg/dL (ref 6–20)
CO2: 24 mmol/L (ref 22–32)
Calcium: 8.8 mg/dL — ABNORMAL LOW (ref 8.9–10.3)
Chloride: 104 mmol/L (ref 98–111)
Creatinine, Ser: 0.82 mg/dL (ref 0.61–1.24)
GFR, Estimated: >60 mL/min (ref >60)
Glucose, Bld: 127 mg/dL — ABNORMAL HIGH (ref 70–99)
Potassium: 4.4 mmol/L (ref 3.5–5.1)
Sodium: 137 mmol/L (ref 135–145)
Total Bilirubin: 1.2 mg/dL (ref 0.3–1.2)
Total Protein: 7.1 g/dL (ref 6.5–8.1)

## 2023-01-17 LAB — LIPID PANEL
Cholesterol: 111 mg/dL (ref 0–200)
HDL: 42 mg/dL (ref >40)
LDL Cholesterol: 56 mg/dL (ref 0–99)
Total CHOL/HDL Ratio: 2.6 RATIO
Triglycerides: 67 mg/dL (ref <150)
VLDL: 13 mg/dL (ref 0–40)

## 2023-01-23 ENCOUNTER — Encounter: Payer: Self-pay | Admitting: Physician Assistant

## 2023-01-23 ENCOUNTER — Ambulatory Visit: Payer: Self-pay | Admitting: Physician Assistant

## 2023-01-23 VITALS — BP 105/70 | HR 64 | Temp 96.7°F | Wt 263.0 lb

## 2023-01-23 DIAGNOSIS — E119 Type 2 diabetes mellitus without complications: Secondary | ICD-10-CM

## 2023-01-23 DIAGNOSIS — E669 Obesity, unspecified: Secondary | ICD-10-CM

## 2023-01-23 DIAGNOSIS — E785 Hyperlipidemia, unspecified: Secondary | ICD-10-CM

## 2023-01-23 MED ORDER — GLIPIZIDE 5 MG PO TABS: 5.0000 mg | ORAL_TABLET | Freq: Every day | ORAL | 1 refills | Status: DC

## 2023-01-23 MED ORDER — JANUMET 50-500 MG PO TABS: 1.0000 | ORAL_TABLET | Freq: Two times a day (BID) | ORAL | 0 refills | Status: DC

## 2023-01-23 MED ORDER — LOSARTAN POTASSIUM 25 MG PO TABS: 25.0000 mg | ORAL_TABLET | Freq: Every day | ORAL | 0 refills | Status: DC

## 2023-01-23 MED ORDER — ATORVASTATIN CALCIUM 20 MG PO TABS: 20.0000 mg | ORAL_TABLET | Freq: Every day | ORAL | 0 refills | Status: DC

## 2023-01-23 NOTE — Progress Notes (Signed)
BP 105/70   Pulse 64   Temp (!) 96.7 F (35.9 C)   Wt 263 lb (119.3 kg)   SpO2 97%   BMI 38.84 kg/m    Subjective:    Patient ID: Christopher Compton, male    DOB: Jun 30, 1975, 47 y.o.   MRN: [id number]  HPI: Keveon Niven is a 47 y.o. 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 Interim medical history since our last visit reviewed. Allergies and medications reviewed and updated.    Current Outpatient Medications:    atorvastatin (LIPITOR) 20 MG tablet, Take 1 tablet (20 mg total) by mouth daily., Disp: 90 tablet, Rfl: 0   losartan (COZAAR) 25 MG tablet, Take 1 tablet (25 mg total) by mouth daily., Disp: 90 tablet, Rfl: 0   psyllium (METAMUCIL) 58.6 % powder, Take 1 packet by mouth 2 (two) times daily., Disp: , Rfl:    sitaGLIPtin-metformin (JANUMET) 50-500 MG tablet, Take 1 tablet by mouth 2 (two) times daily with a meal., Disp: 180 tablet, Rfl: 0   Review of Systems  Per HPI unless specifically indicated above     Objective:    BP 105/70   Pulse 64   Temp (!) 96.7 F (35.9 C)   Wt 263 lb (119.3 kg)   SpO2 97%   BMI 38.84 kg/m   Wt Readings from Last 3 Encounters:  01/23/23 263 lb (119.3 kg)  10/21/22 265 lb (120.2 kg)  07/22/22 265 lb (120.2 kg)    Physical Exam Vitals reviewed.  Constitutional:      General: He is not in acute distress.    Appearance: He is well-developed. He is obese. He is not toxic-appearing.  HENT:     Head: Normocephalic and atraumatic.  Cardiovascular:     Rate and Rhythm: Normal rate and regular rhythm.  Pulmonary:     Effort:  Pulmonary effort is normal.     Breath sounds: Normal breath sounds. No wheezing.  Abdominal:     General: Bowel sounds are normal.     Palpations: Abdomen is soft.     Tenderness: There is no abdominal tenderness.  Musculoskeletal:     Cervical back: Neck supple.     Right lower leg: No edema.     Left lower leg: No edema.  Lymphadenopathy:     Cervical: No cervical adenopathy.  Neurological:     Mental Status: He is alert and oriented to person, place, and time.  Psychiatric:        Behavior: Behavior normal.     Results for orders placed or performed during the hospital encounter of 01/17/23  Hemoglobin A1c  Result Value Ref Range   Hgb A1c MFr Bld 7.3 (H) 4.8 - 5.6 %   Mean Plasma Glucose 163 mg/dL  Lipid panel  Result Value Ref Range   Cholesterol 111 0 - 200 mg/dL   Triglycerides 67 <409 mg/dL   HDL 42 >81 mg/dL   Total CHOL/HDL Ratio 2.6 RATIO   VLDL 13 0 - 40 mg/dL   LDL Cholesterol 56 0 -  99 mg/dL  Comprehensive metabolic panel  Result Value Ref Range   Sodium 137 135 - 145 mmol/L   Potassium 4.4 3.5 - 5.1 mmol/L   Chloride 104 98 - 111 mmol/L   CO2 24 22 - 32 mmol/L   Glucose, Bld 127 (H) 70 - 99 mg/dL   BUN 15 6 - 20 mg/dL   Creatinine, Ser 4.09 0.61 - 1.24 mg/dL   Calcium 8.8 (L) 8.9 - 10.3 mg/dL   Total Protein 7.1 6.5 - 8.1 g/dL   Albumin 4.0 3.5 - 5.0 g/dL   AST 25 15 - 41 U/L   ALT 27 0 - 44 U/L   Alkaline Phosphatase 77 38 - 126 U/L   Total Bilirubin 1.2 0.3 - 1.2 mg/dL   GFR, Estimated >81 >19 mL/min   Anion gap 9 5 - 15      Assessment & Plan:    Encounter Diagnoses  Name Primary?   Diabetes mellitus without complication (HCC) Yes   Hyperlipidemia, unspecified hyperlipidemia type    Obesity, unspecified classification, unspecified obesity type, unspecified whether serious comorbidity present      -reviewed labs with pt -For DM, Larger doses metformin given diarrhea in the past so will Add glipizide 5 -FIT test next time/will be  47yo Bp and cholesterol well controlled -pt was counseled on foot care/changing socks at lunch break when working to avoid tinea pedis -pt will follow up 3 months.  He is to contact office sooner prn

## 2023-03-27 ENCOUNTER — Other Ambulatory Visit: Payer: Self-pay | Admitting: Physician Assistant

## 2023-04-02 ENCOUNTER — Other Ambulatory Visit: Payer: Self-pay | Admitting: Physician Assistant

## 2023-04-02 DIAGNOSIS — E785 Hyperlipidemia, unspecified: Secondary | ICD-10-CM

## 2023-04-02 DIAGNOSIS — E119 Type 2 diabetes mellitus without complications: Secondary | ICD-10-CM

## 2023-04-10 ENCOUNTER — Ambulatory Visit: Payer: Self-pay | Admitting: Physician Assistant

## 2023-04-18 ENCOUNTER — Other Ambulatory Visit (HOSPITAL_COMMUNITY)
Admission: RE | Admit: 2023-04-18 | Discharge: 2023-04-18 | Disposition: A | Discharge status: Home / Self Care | Payer: Self-pay | Source: Ambulatory Visit | Attending: Physician Assistant | Admitting: Physician Assistant

## 2023-04-18 DIAGNOSIS — E119 Type 2 diabetes mellitus without complications: Secondary | ICD-10-CM | POA: Insufficient documentation

## 2023-04-18 DIAGNOSIS — E785 Hyperlipidemia, unspecified: Secondary | ICD-10-CM | POA: Insufficient documentation

## 2023-04-18 LAB — LIPID PANEL
Cholesterol: 115 mg/dL (ref 0–200)
HDL: 42 mg/dL (ref >40)
LDL Cholesterol: 61 mg/dL (ref 0–99)
Total CHOL/HDL Ratio: 2.7 {ratio}
Triglycerides: 58 mg/dL (ref <150)
VLDL: 12 mg/dL (ref 0–40)

## 2023-04-18 LAB — COMPREHENSIVE METABOLIC PANEL
ALT: 30 U/L (ref 0–44)
AST: 24 U/L (ref 15–41)
Albumin: 4 g/dL (ref 3.5–5.0)
Alkaline Phosphatase: 61 U/L (ref 38–126)
Anion gap: 7 (ref 5–15)
BUN: 16 mg/dL (ref 6–20)
CO2: 26 mmol/L (ref 22–32)
Calcium: 8.9 mg/dL (ref 8.9–10.3)
Chloride: 104 mmol/L (ref 98–111)
Creatinine, Ser: 0.85 mg/dL (ref 0.61–1.24)
GFR, Estimated: >60 mL/min (ref >60)
Glucose, Bld: 126 mg/dL — ABNORMAL HIGH (ref 70–99)
Potassium: 4 mmol/L (ref 3.5–5.1)
Sodium: 137 mmol/L (ref 135–145)
Total Bilirubin: 1.1 mg/dL (ref 0.3–1.2)
Total Protein: 6.9 g/dL (ref 6.5–8.1)

## 2023-04-18 LAB — HEMOGLOBIN A1C
Hgb A1c MFr Bld: 7.2 % — ABNORMAL HIGH (ref 4.8–5.6)
Mean Plasma Glucose: 159.94 mg/dL

## 2023-04-21 ENCOUNTER — Encounter: Payer: Self-pay | Admitting: Physician Assistant

## 2023-04-21 ENCOUNTER — Ambulatory Visit: Payer: Self-pay | Admitting: Physician Assistant

## 2023-04-21 VITALS — BP 131/82 | HR 83 | Temp 97.1°F | Wt 265.0 lb

## 2023-04-21 DIAGNOSIS — Z6839 Body mass index (BMI) 39.0-39.9, adult: Secondary | ICD-10-CM

## 2023-04-21 DIAGNOSIS — E785 Hyperlipidemia, unspecified: Secondary | ICD-10-CM

## 2023-04-21 DIAGNOSIS — E119 Type 2 diabetes mellitus without complications: Secondary | ICD-10-CM

## 2023-04-21 DIAGNOSIS — E66812 Obesity, class 2: Secondary | ICD-10-CM

## 2023-04-21 MED ORDER — ATORVASTATIN CALCIUM 20 MG PO TABS: 20.0000 mg | ORAL_TABLET | Freq: Every day | ORAL | 0 refills | Status: DC

## 2023-04-21 MED ORDER — JANUMET 50-500 MG PO TABS: 1.0000 | ORAL_TABLET | Freq: Two times a day (BID) | ORAL | 0 refills | Status: DC

## 2023-04-21 MED ORDER — LOSARTAN POTASSIUM 25 MG PO TABS: 25.0000 mg | ORAL_TABLET | Freq: Every day | ORAL | 0 refills | Status: DC

## 2023-04-21 MED ORDER — GLIPIZIDE 5 MG PO TABS: 5.0000 mg | ORAL_TABLET | Freq: Every day | ORAL | 1 refills | Status: DC

## 2023-04-21 NOTE — Progress Notes (Signed)
BP 131/82   Pulse 83   Temp (!) 97.1 F (36.2 C)   Wt 265 lb (120.2 kg)   SpO2 97%   BMI 39.13 kg/m    Subjective:    Patient ID: Christopher Compton, male    DOB: [birth year], 47 y.o.   MRN: [id number]  HPI: Christopher Compton is a 47 y.o. male presenting on 04/21/2023 for Diabetes and Hyperlipidemia   HPI   Chief Complaint  Patient presents with   Diabetes   Hyperlipidemia    He says the glipizide makes him a little bit dizzy when he takes it in the morning.  Other than that, he is feeling well.  He is trying to exercise some, doing walks during his lunch.    Relevant past medical, surgical, family and social history reviewed and updated as indicated. Interim medical history since our last visit reviewed. Allergies and medications reviewed and updated.   Current Outpatient Medications:    atorvastatin (LIPITOR) 20 MG tablet, TAKE 1 Tablet BY MOUTH ONCE DAILY, Disp: 90 tablet, Rfl: 0   glipiZIDE (GLUCOTROL) 5 MG tablet, Take 1 tablet (5 mg total) by mouth daily before breakfast., Disp: 90 tablet, Rfl: 1   JANUMET 50-500 MG tablet, TAKE 1 Tablet  BY MOUTH TWICE DAILY WITH A MEAL, Disp: 180 tablet, Rfl: 0   losartan (COZAAR) 25 MG tablet, TAKE 1 Tablet BY MOUTH ONCE DAILY, Disp: 90 tablet, Rfl: 0   psyllium (METAMUCIL) 58.6 % powder, Take 1 packet by mouth 2 (two) times daily. (Patient not taking: Reported on 04/21/2023), Disp: , Rfl:     Review of Systems  Per HPI unless specifically indicated above     Objective:    BP 131/82   Pulse 83   Temp (!) 97.1 F (36.2 C)   Wt 265 lb (120.2 kg)   SpO2 97%   BMI 39.13 kg/m   Wt Readings from Last 3 Encounters:  04/21/23 265 lb (120.2 kg)  01/23/23 263 lb (119.3 kg)  10/21/22 265 lb (120.2 kg)    Physical Exam Vitals reviewed.  Constitutional:      General: He is not in acute distress.    Appearance: He is well-developed. He is obese. He is not toxic-appearing.  HENT:     Head: Normocephalic and  atraumatic.  Cardiovascular:     Rate and Rhythm: Normal rate and regular rhythm.  Pulmonary:     Effort: Pulmonary effort is normal.     Breath sounds: Normal breath sounds. No wheezing.  Abdominal:     General: Bowel sounds are normal.     Palpations: Abdomen is soft.     Tenderness: There is no abdominal tenderness.  Musculoskeletal:     Cervical back: Neck supple.     Right lower leg: No edema.     Left lower leg: No edema.  Lymphadenopathy:     Cervical: No cervical adenopathy.  Skin:    General: Skin is warm and dry.  Neurological:     Mental Status: He is alert and oriented to person, place, and time.  Psychiatric:        Behavior: Behavior normal.     Results for orders placed or performed during the hospital encounter of 04/18/23  Hemoglobin A1c  Result Value Ref Range   Hgb A1c MFr Bld 7.2 (H) 4.8 - 5.6 %   Mean Plasma Glucose 159.94 mg/dL  Lipid panel  Result Value Ref Range   Cholesterol 115 0 - 200 mg/dL  Triglycerides 58 <150 mg/dL   HDL 42 >44 mg/dL   Total CHOL/HDL Ratio 2.7 RATIO   VLDL 12 0 - 40 mg/dL   LDL Cholesterol 61 0 - 99 mg/dL  Comprehensive metabolic panel  Result Value Ref Range   Sodium 137 135 - 145 mmol/L   Potassium 4.0 3.5 - 5.1 mmol/L   Chloride 104 98 - 111 mmol/L   CO2 26 22 - 32 mmol/L   Glucose, Bld 126 (H) 70 - 99 mg/dL   BUN 16 6 - 20 mg/dL   Creatinine, Ser 0.10 0.61 - 1.24 mg/dL   Calcium 8.9 8.9 - 27.2 mg/dL   Total Protein 6.9 6.5 - 8.1 g/dL   Albumin 4.0 3.5 - 5.0 g/dL   AST 24 15 - 41 U/L   ALT 30 0 - 44 U/L   Alkaline Phosphatase 61 38 - 126 U/L   Total Bilirubin 1.1 0.3 - 1.2 mg/dL   GFR, Estimated >53 >66 mL/min   Anion gap 7 5 - 15      Assessment & Plan:   Encounter Diagnoses  Name Primary?   Diabetes mellitus without complication (HCC) Yes   Hyperlipidemia, unspecified hyperlipidemia type    Class 2 obesity with body mass index (BMI) of 39.0 to 39.9 in adult, unspecified obesity type, unspecified  whether serious comorbidity present     -Reviewed labs with pt  -refer for DM eye exam which is due after January 10 -Pt counseled to Take glipizide with evening meal -pt to continue atorvastatin, losartan and janumet -pt is given Flu shot voucher -pt to follow up 3 months. He is to contact office sooner prn

## 2023-06-30 ENCOUNTER — Other Ambulatory Visit: Payer: Self-pay | Admitting: Physician Assistant

## 2023-06-30 DIAGNOSIS — E119 Type 2 diabetes mellitus without complications: Secondary | ICD-10-CM

## 2023-06-30 DIAGNOSIS — E785 Hyperlipidemia, unspecified: Secondary | ICD-10-CM

## 2023-07-16 ENCOUNTER — Other Ambulatory Visit (HOSPITAL_COMMUNITY)
Admission: RE | Admit: 2023-07-16 | Discharge: 2023-07-16 | Disposition: A | Discharge status: Home / Self Care | Payer: Self-pay | Source: Ambulatory Visit | Attending: Physician Assistant | Admitting: Physician Assistant

## 2023-07-16 DIAGNOSIS — E119 Type 2 diabetes mellitus without complications: Secondary | ICD-10-CM | POA: Insufficient documentation

## 2023-07-16 DIAGNOSIS — E785 Hyperlipidemia, unspecified: Secondary | ICD-10-CM | POA: Insufficient documentation

## 2023-07-16 LAB — COMPREHENSIVE METABOLIC PANEL
ALT: 29 U/L (ref 0–44)
AST: 30 U/L (ref 15–41)
Albumin: 4.2 g/dL (ref 3.5–5.0)
Alkaline Phosphatase: 85 U/L (ref 38–126)
Anion gap: 7 (ref 5–15)
BUN: 15 mg/dL (ref 6–20)
CO2: 28 mmol/L (ref 22–32)
Calcium: 9.4 mg/dL (ref 8.9–10.3)
Chloride: 103 mmol/L (ref 98–111)
Creatinine, Ser: 0.84 mg/dL (ref 0.61–1.24)
GFR, Estimated: >60 mL/min (ref >60)
Glucose, Bld: 127 mg/dL — ABNORMAL HIGH (ref 70–99)
Potassium: 4.6 mmol/L (ref 3.5–5.1)
Sodium: 138 mmol/L (ref 135–145)
Total Bilirubin: 0.7 mg/dL (ref 0.0–1.2)
Total Protein: 7.2 g/dL (ref 6.5–8.1)

## 2023-07-16 LAB — LIPID PANEL
Cholesterol: 129 mg/dL (ref 0–200)
HDL: 48 mg/dL (ref >40)
LDL Cholesterol: 70 mg/dL (ref 0–99)
Total CHOL/HDL Ratio: 2.7 {ratio}
Triglycerides: 54 mg/dL (ref <150)
VLDL: 11 mg/dL (ref 0–40)

## 2023-07-16 LAB — HEMOGLOBIN A1C
Hgb A1c MFr Bld: 6.9 % — ABNORMAL HIGH (ref 4.8–5.6)
Mean Plasma Glucose: 151.33 mg/dL

## 2023-07-17 LAB — MICROALBUMIN, URINE: Microalb, Ur: 4.9 ug/mL — ABNORMAL HIGH

## 2023-07-21 ENCOUNTER — Encounter: Payer: Self-pay | Admitting: Physician Assistant

## 2023-07-21 ENCOUNTER — Ambulatory Visit: Payer: Self-pay | Admitting: Physician Assistant

## 2023-07-21 ENCOUNTER — Other Ambulatory Visit: Payer: Self-pay | Admitting: Physician Assistant

## 2023-07-21 VITALS — BP 118/77 | HR 62 | Temp 97.0°F | Wt 267.0 lb

## 2023-07-21 DIAGNOSIS — Z1211 Encounter for screening for malignant neoplasm of colon: Secondary | ICD-10-CM

## 2023-07-21 DIAGNOSIS — E785 Hyperlipidemia, unspecified: Secondary | ICD-10-CM

## 2023-07-21 DIAGNOSIS — E119 Type 2 diabetes mellitus without complications: Secondary | ICD-10-CM

## 2023-07-21 MED ORDER — ATORVASTATIN CALCIUM 20 MG PO TABS: 20.0000 mg | ORAL_TABLET | Freq: Every day | ORAL | 0 refills | Status: DC

## 2023-07-21 MED ORDER — LOSARTAN POTASSIUM 25 MG PO TABS: 25.0000 mg | ORAL_TABLET | Freq: Every day | ORAL | 0 refills | Status: DC

## 2023-07-21 MED ORDER — JANUMET 50-500 MG PO TABS: 1.0000 | ORAL_TABLET | Freq: Two times a day (BID) | ORAL | 0 refills | Status: DC

## 2023-07-21 MED ORDER — GLIPIZIDE 5 MG PO TABS: 5.0000 mg | ORAL_TABLET | Freq: Every day | ORAL | 1 refills | Status: DC

## 2023-07-21 NOTE — Progress Notes (Signed)
BP 118/77   Pulse 62   Temp (!) 97 F (36.1 C)   Wt 267 lb (121.1 kg)   SpO2 97%   BMI 39.43 kg/m    Subjective:    Patient ID: Christopher Compton, male    DOB: 1976-06-22, 48 y.o.   MRN: [id number]  HPI: Christopher Compton is a 48 y.o. male presenting on 07/21/2023 for Diabetes and Hyperlipidemia   HPI  Chief Complaint  Patient presents with   Diabetes   Hyperlipidemia    Pt says he Tries to exercise but is difficult with the cold weather.  He says he is doing well and he has no complaints.  He denies CP, sob, abdominal pain.    Relevant past medical, surgical, family and social history reviewed and updated as indicated. Interim medical history since our last visit reviewed. Allergies and medications reviewed and updated.   Current Outpatient Medications:    atorvastatin (LIPITOR) 20 MG tablet, Take 1 tablet (20 mg total) by mouth daily., Disp: 90 tablet, Rfl: 0   glipiZIDE (GLUCOTROL) 5 MG tablet, Take 1 tablet (5 mg total) by mouth daily. With evening meal, Disp: 90 tablet, Rfl: 1   losartan (COZAAR) 25 MG tablet, Take 1 tablet (25 mg total) by mouth daily., Disp: 90 tablet, Rfl: 0   sitaGLIPtin-metformin (JANUMET) 50-500 MG tablet, Take 1 tablet by mouth 2 (two) times daily with a meal., Disp: 180 tablet, Rfl: 0   psyllium (METAMUCIL) 58.6 % powder, Take 1 packet by mouth 2 (two) times daily. (Patient not taking: Reported on 07/21/2023), Disp: , Rfl:    Review of Systems  Per HPI unless specifically indicated above     Objective:    BP 118/77   Pulse 62   Temp (!) 97 F (36.1 C)   Wt 267 lb (121.1 kg)   SpO2 97%   BMI 39.43 kg/m   Wt Readings from Last 3 Encounters:  07/21/23 267 lb (121.1 kg)  04/21/23 265 lb (120.2 kg)  01/23/23 263 lb (119.3 kg)    Physical Exam Vitals reviewed.  Constitutional:      General: He is not in acute distress.    Appearance: He is well-developed. He is obese. He is not toxic-appearing.  HENT:     Head:  Normocephalic and atraumatic.  Cardiovascular:     Rate and Rhythm: Normal rate and regular rhythm.  Pulmonary:     Effort: Pulmonary effort is normal.     Breath sounds: Normal breath sounds. No wheezing.  Abdominal:     General: Bowel sounds are normal.     Palpations: Abdomen is soft.     Tenderness: There is no abdominal tenderness.  Musculoskeletal:     Cervical back: Neck supple.     Right lower leg: No edema.     Left lower leg: No edema.  Lymphadenopathy:     Cervical: No cervical adenopathy.  Skin:    General: Skin is warm and dry.  Neurological:     Mental Status: He is alert and oriented to person, place, and time.  Psychiatric:        Behavior: Behavior normal.     Results for orders placed or performed during the hospital encounter of 07/16/23  Microalbumin, urine   Collection Time: 07/16/23  8:58 AM  Result Value Ref Range   Microalb, Ur 4.9 (H) Not Estab. ug/mL  Hemoglobin A1c   Collection Time: 07/16/23  8:58 AM  Result Value Ref Range   Hgb A1c  MFr Bld 6.9 (H) 4.8 - 5.6 %   Mean Plasma Glucose 151.33 mg/dL  Lipid panel   Collection Time: 07/16/23  8:58 AM  Result Value Ref Range   Cholesterol 129 0 - 200 mg/dL   Triglycerides 54 <161 mg/dL   HDL 48 >09 mg/dL   Total CHOL/HDL Ratio 2.7 RATIO   VLDL 11 0 - 40 mg/dL   LDL Cholesterol 70 0 - 99 mg/dL  Comprehensive metabolic panel   Collection Time: 07/16/23  8:58 AM  Result Value Ref Range   Sodium 138 135 - 145 mmol/L   Potassium 4.6 3.5 - 5.1 mmol/L   Chloride 103 98 - 111 mmol/L   CO2 28 22 - 32 mmol/L   Glucose, Bld 127 (H) 70 - 99 mg/dL   BUN 15 6 - 20 mg/dL   Creatinine, Ser 6.04 0.61 - 1.24 mg/dL   Calcium 9.4 8.9 - 54.0 mg/dL   Total Protein 7.2 6.5 - 8.1 g/dL   Albumin 4.2 3.5 - 5.0 g/dL   AST 30 15 - 41 U/L   ALT 29 0 - 44 U/L   Alkaline Phosphatase 85 38 - 126 U/L   Total Bilirubin 0.7 0.0 - 1.2 mg/dL   GFR, Estimated >98 >11 mL/min   Anion gap 7 5 - 15      Assessment & Plan:     Encounter Diagnoses  Name Primary?   Diabetes mellitus without complication (HCC) Yes   Hyperlipidemia, unspecified hyperlipidemia type    Screening for colon cancer       -reviewed labs with pt -pt to continue current medications -pt encouraged to exercise more regularly to help with weight management and medical conditions -pt is given FIT test for colon cancer screening -pt to follow up in 3 months.. he is to contact office sooner prn

## 2023-07-24 LAB — POC FIT TEST STOOL: Fecal Occult Blood: NEGATIVE

## 2023-09-18 ENCOUNTER — Encounter: Payer: Self-pay | Admitting: Physician Assistant

## 2023-10-06 ENCOUNTER — Other Ambulatory Visit: Payer: Self-pay | Admitting: Physician Assistant

## 2023-10-06 DIAGNOSIS — E119 Type 2 diabetes mellitus without complications: Secondary | ICD-10-CM

## 2023-10-06 DIAGNOSIS — E785 Hyperlipidemia, unspecified: Secondary | ICD-10-CM

## 2023-10-17 ENCOUNTER — Other Ambulatory Visit (HOSPITAL_COMMUNITY)
Admission: RE | Admit: 2023-10-17 | Discharge: 2023-10-17 | Disposition: A | Discharge status: Home / Self Care | Payer: Self-pay | Source: Ambulatory Visit | Attending: Physician Assistant | Admitting: Physician Assistant

## 2023-10-17 DIAGNOSIS — E119 Type 2 diabetes mellitus without complications: Secondary | ICD-10-CM | POA: Insufficient documentation

## 2023-10-17 DIAGNOSIS — E785 Hyperlipidemia, unspecified: Secondary | ICD-10-CM | POA: Insufficient documentation

## 2023-10-17 LAB — COMPREHENSIVE METABOLIC PANEL WITH GFR
ALT: 36 U/L (ref 0–44)
AST: 39 U/L (ref 15–41)
Albumin: 3.7 g/dL (ref 3.5–5.0)
Alkaline Phosphatase: 67 U/L (ref 38–126)
Anion gap: 9 (ref 5–15)
BUN: 16 mg/dL (ref 6–20)
CO2: 23 mmol/L (ref 22–32)
Calcium: 8.8 mg/dL — ABNORMAL LOW (ref 8.9–10.3)
Chloride: 105 mmol/L (ref 98–111)
Creatinine, Ser: 0.89 mg/dL (ref 0.61–1.24)
GFR, Estimated: >60 mL/min (ref >60)
Glucose, Bld: 111 mg/dL — ABNORMAL HIGH (ref 70–99)
Potassium: 4.1 mmol/L (ref 3.5–5.1)
Sodium: 137 mmol/L (ref 135–145)
Total Bilirubin: 1 mg/dL (ref 0.0–1.2)
Total Protein: 6.7 g/dL (ref 6.5–8.1)

## 2023-10-17 LAB — LIPID PANEL
Cholesterol: 107 mg/dL (ref 0–200)
HDL: 39 mg/dL — ABNORMAL LOW
LDL Cholesterol: 56 mg/dL (ref 0–99)
Total CHOL/HDL Ratio: 2.7 ratio
Triglycerides: 61 mg/dL
VLDL: 12 mg/dL (ref 0–40)

## 2023-10-17 LAB — HEMOGLOBIN A1C
Hgb A1c MFr Bld: 6 % — ABNORMAL HIGH (ref 4.8–5.6)
Mean Plasma Glucose: 125.5 mg/dL

## 2023-10-20 ENCOUNTER — Ambulatory Visit: Payer: Self-pay | Admitting: Physician Assistant

## 2023-10-23 ENCOUNTER — Encounter: Payer: Self-pay | Admitting: Physician Assistant

## 2023-10-23 ENCOUNTER — Ambulatory Visit: Payer: Self-pay | Admitting: Physician Assistant

## 2023-10-23 VITALS — BP 116/78 | HR 72 | Temp 97.2°F | Wt 270.0 lb

## 2023-10-23 DIAGNOSIS — E785 Hyperlipidemia, unspecified: Secondary | ICD-10-CM

## 2023-10-23 DIAGNOSIS — E119 Type 2 diabetes mellitus without complications: Secondary | ICD-10-CM

## 2023-10-23 MED ORDER — LOSARTAN POTASSIUM 25 MG PO TABS: 25.0000 mg | ORAL_TABLET | Freq: Every day | ORAL | 1 refills | Status: DC

## 2023-10-23 MED ORDER — ATORVASTATIN CALCIUM 20 MG PO TABS: 20.0000 mg | ORAL_TABLET | Freq: Every day | ORAL | 1 refills | Status: DC

## 2023-10-23 MED ORDER — GLIPIZIDE 5 MG PO TABS: 5.0000 mg | ORAL_TABLET | Freq: Every day | ORAL | 1 refills | Status: DC

## 2023-10-23 MED ORDER — JANUMET 50-500 MG PO TABS: 1.0000 | ORAL_TABLET | Freq: Two times a day (BID) | ORAL | 1 refills | Status: DC

## 2023-10-23 NOTE — Progress Notes (Signed)
 BP 116/78   Pulse 72   Temp (!) 97.2 F (36.2 C)   Wt 270 lb (122.5 kg)   SpO2 96%   BMI 39.87 kg/m    Subjective:    Patient ID: Christopher Compton, male    DOB: Mar 08, 1976, 48 y.o.   MRN: [id number]  HPI: Christopher Compton is a 48 y.o. male presenting on 10/23/2023 for Diabetes and Hyperlipidemia   HPI  Chief Complaint  Patient presents with   Diabetes   Hyperlipidemia    Pt says he is doing well and he has no complaints other than some seasonal allergies.    Relevant past medical, surgical, family and social history reviewed and updated as indicated. Interim medical history since our last visit reviewed. Allergies and medications reviewed and updated.   Current Outpatient Medications:    atorvastatin  (LIPITOR) 20 MG tablet, Take 1 tablet (20 mg total) by mouth daily., Disp: 90 tablet, Rfl: 0   glipiZIDE  (GLUCOTROL ) 5 MG tablet, Take 1 tablet (5 mg total) by mouth daily. With evening meal, Disp: 90 tablet, Rfl: 1   losartan  (COZAAR ) 25 MG tablet, Take 1 tablet (25 mg total) by mouth daily., Disp: 90 tablet, Rfl: 0   psyllium (METAMUCIL) 58.6 % powder, Take 1 packet by mouth 2 (two) times daily., Disp: , Rfl:    sitaGLIPtin-metformin  (JANUMET ) 50-500 MG tablet, Take 1 tablet by mouth 2 (two) times daily with a meal., Disp: 180 tablet, Rfl: 0    Review of Systems  Per HPI unless specifically indicated above     Objective:    BP 116/78   Pulse 72   Temp (!) 97.2 F (36.2 C)   Wt 270 lb (122.5 kg)   SpO2 96%   BMI 39.87 kg/m   Wt Readings from Last 3 Encounters:  10/23/23 270 lb (122.5 kg)  07/21/23 267 lb (121.1 kg)  04/21/23 265 lb (120.2 kg)    Physical Exam Vitals reviewed.  Constitutional:      General: He is not in acute distress.    Appearance: He is well-developed. He is obese. He is not toxic-appearing.  HENT:     Head: Normocephalic and atraumatic.  Cardiovascular:     Rate and Rhythm: Normal rate and regular rhythm.  Pulmonary:      Effort: Pulmonary effort is normal.     Breath sounds: Normal breath sounds. No wheezing.  Abdominal:     General: Bowel sounds are normal.     Palpations: Abdomen is soft.     Tenderness: There is no abdominal tenderness.  Musculoskeletal:     Cervical back: Neck supple.     Right lower leg: No edema.     Left lower leg: No edema.  Lymphadenopathy:     Cervical: No cervical adenopathy.  Skin:    General: Skin is warm and dry.  Neurological:     Mental Status: He is alert and oriented to person, place, and time.  Psychiatric:        Behavior: Behavior normal.     Results for orders placed or performed during the hospital encounter of 10/17/23  Lipid panel   Collection Time: 10/17/23  8:39 AM  Result Value Ref Range   Cholesterol 107 0 - 200 mg/dL   Triglycerides 61 <409 mg/dL   HDL 39 (L) >81 mg/dL   Total CHOL/HDL Ratio 2.7 RATIO   VLDL 12 0 - 40 mg/dL   LDL Cholesterol 56 0 - 99 mg/dL  Comprehensive metabolic panel with  GFR   Collection Time: 10/17/23  8:39 AM  Result Value Ref Range   Sodium 137 135 - 145 mmol/L   Potassium 4.1 3.5 - 5.1 mmol/L   Chloride 105 98 - 111 mmol/L   CO2 23 22 - 32 mmol/L   Glucose, Bld 111 (H) 70 - 99 mg/dL   BUN 16 6 - 20 mg/dL   Creatinine, Ser 1.61 0.61 - 1.24 mg/dL   Calcium  8.8 (L) 8.9 - 10.3 mg/dL   Total Protein 6.7 6.5 - 8.1 g/dL   Albumin 3.7 3.5 - 5.0 g/dL   AST 39 15 - 41 U/L   ALT 36 0 - 44 U/L   Alkaline Phosphatase 67 38 - 126 U/L   Total Bilirubin 1.0 0.0 - 1.2 mg/dL   GFR, Estimated >09 >60 mL/min   Anion gap 9 5 - 15  Hemoglobin A1c   Collection Time: 10/17/23  8:39 AM  Result Value Ref Range   Hgb A1c MFr Bld 6.0 (H) 4.8 - 5.6 %   Mean Plasma Glucose 125.5 mg/dL      Assessment & Plan:     Encounter Diagnoses  Name Primary?   Diabetes mellitus without complication (HCC) Yes   Hyperlipidemia, unspecified hyperlipidemia type      -reviewed labs with pt -will continue current medications -updated DM  foot exam -pt to follow up four months.  He is to contact office sooner prn

## 2023-11-21 ENCOUNTER — Telehealth: Payer: Self-pay

## 2023-11-21 NOTE — Telephone Encounter (Signed)
 Attempted follow up call with interpreter services, no answer, left message to contact [facility] if has any needs.  Last Seen 10/23/23 next appointment 03/01/24  Will continue to attempt to reach for any questions or services of [facility] he may need.  His Last A1C was 6.0 on 10/17/23  Kris Pester RN [facility]

## 2024-02-16 ENCOUNTER — Other Ambulatory Visit: Payer: Self-pay | Admitting: Physician Assistant

## 2024-02-16 DIAGNOSIS — E119 Type 2 diabetes mellitus without complications: Secondary | ICD-10-CM

## 2024-02-16 DIAGNOSIS — E785 Hyperlipidemia, unspecified: Secondary | ICD-10-CM

## 2024-02-27 ENCOUNTER — Other Ambulatory Visit (HOSPITAL_COMMUNITY)
Admission: RE | Admit: 2024-02-27 | Discharge: 2024-02-27 | Disposition: A | Discharge status: Home / Self Care | Payer: Self-pay | Source: Ambulatory Visit | Attending: Physician Assistant | Admitting: Physician Assistant

## 2024-02-27 DIAGNOSIS — E785 Hyperlipidemia, unspecified: Secondary | ICD-10-CM | POA: Insufficient documentation

## 2024-02-27 DIAGNOSIS — E119 Type 2 diabetes mellitus without complications: Secondary | ICD-10-CM | POA: Insufficient documentation

## 2024-02-27 LAB — COMPREHENSIVE METABOLIC PANEL WITH GFR
ALT: 31 U/L (ref 0–44)
AST: 31 U/L (ref 15–41)
Albumin: 3.9 g/dL (ref 3.5–5.0)
Alkaline Phosphatase: 73 U/L (ref 38–126)
Anion gap: 8 (ref 5–15)
BUN: 18 mg/dL (ref 6–20)
CO2: 25 mmol/L (ref 22–32)
Calcium: 8.9 mg/dL (ref 8.9–10.3)
Chloride: 104 mmol/L (ref 98–111)
Creatinine, Ser: 0.81 mg/dL (ref 0.61–1.24)
GFR, Estimated: >60 mL/min (ref >60)
Glucose, Bld: 121 mg/dL — ABNORMAL HIGH (ref 70–99)
Potassium: 4.4 mmol/L (ref 3.5–5.1)
Sodium: 137 mmol/L (ref 135–145)
Total Bilirubin: 0.8 mg/dL (ref 0.0–1.2)
Total Protein: 6.9 g/dL (ref 6.5–8.1)

## 2024-02-27 LAB — HEMOGLOBIN A1C
Hgb A1c MFr Bld: 7.3 % — ABNORMAL HIGH (ref 4.8–5.6)
Mean Plasma Glucose: 162.81 mg/dL

## 2024-02-27 LAB — LIPID PANEL
Cholesterol: 112 mg/dL (ref 0–200)
HDL: 40 mg/dL — ABNORMAL LOW (ref >40)
LDL Cholesterol: 55 mg/dL (ref 0–99)
Total CHOL/HDL Ratio: 2.8 ratio
Triglycerides: 85 mg/dL (ref <150)
VLDL: 17 mg/dL (ref 0–40)

## 2024-03-01 ENCOUNTER — Encounter: Payer: Self-pay | Admitting: Physician Assistant

## 2024-03-01 ENCOUNTER — Ambulatory Visit: Payer: Self-pay | Admitting: Physician Assistant

## 2024-03-01 VITALS — BP 121/75 | HR 62 | Temp 97.4°F | Wt 273.0 lb

## 2024-03-01 DIAGNOSIS — Z6841 Body Mass Index (BMI) 40.0 and over, adult: Secondary | ICD-10-CM

## 2024-03-01 DIAGNOSIS — E119 Type 2 diabetes mellitus without complications: Secondary | ICD-10-CM

## 2024-03-01 DIAGNOSIS — E785 Hyperlipidemia, unspecified: Secondary | ICD-10-CM

## 2024-03-01 MED ORDER — ATORVASTATIN CALCIUM 20 MG PO TABS: 20.0000 mg | ORAL_TABLET | Freq: Every day | ORAL | 1 refills | Status: DC

## 2024-03-01 MED ORDER — GLIPIZIDE 5 MG PO TABS: 5.0000 mg | ORAL_TABLET | Freq: Every day | ORAL | 1 refills | Status: DC

## 2024-03-01 MED ORDER — JANUMET 50-500 MG PO TABS: 1.0000 | ORAL_TABLET | Freq: Two times a day (BID) | ORAL | 1 refills | Status: DC

## 2024-03-01 MED ORDER — LOSARTAN POTASSIUM 25 MG PO TABS: 25.0000 mg | ORAL_TABLET | Freq: Every day | ORAL | 1 refills | Status: DC

## 2024-03-01 NOTE — Progress Notes (Signed)
 BP 121/75   Pulse 62   Temp (!) 97.4 F (36.3 C)   Wt 273 lb (123.8 kg)   SpO2 97%   BMI 40.32 kg/m    Subjective:    Patient ID: Christopher Compton, male    DOB: [birth year], 48 y.o.   MRN: [id number]  HPI: Dashawn Bartnick is a 48 y.o. male presenting on 03/01/2024 for Diabetes and Hyperlipidemia   HPI   He is working.  He exercises sometimes. He ran out of all his meds except the glipizide  about 2 or 3 weeks ago. He says he is doing well and he has no complaints.    Relevant past medical, surgical, family and social history reviewed and updated as indicated. Interim medical history since our last visit reviewed. Allergies and medications reviewed and updated.    Current Outpatient Medications:    glipiZIDE  (GLUCOTROL ) 5 MG tablet, Take 1 tablet (5 mg total) by mouth daily. With evening meal, Disp: 90 tablet, Rfl: 1   atorvastatin  (LIPITOR) 20 MG tablet, Take 1 tablet (20 mg total) by mouth daily. (Patient not taking: Reported on 03/01/2024), Disp: 90 tablet, Rfl: 1   losartan  (COZAAR ) 25 MG tablet, Take 1 tablet (25 mg total) by mouth daily. (Patient not taking: Reported on 03/01/2024), Disp: 90 tablet, Rfl: 1   psyllium (METAMUCIL) 58.6 % powder, Take 1 packet by mouth 2 (two) times daily. (Patient not taking: Reported on 03/01/2024), Disp: , Rfl:    sitaGLIPtin-metformin  (JANUMET ) 50-500 MG tablet, Take 1 tablet by mouth 2 (two) times daily with a meal. (Patient not taking: Reported on 03/01/2024), Disp: 180 tablet, Rfl: 1   Review of Systems  Per HPI unless specifically indicated above     Objective:    BP 121/75   Pulse 62   Temp (!) 97.4 F (36.3 C)   Wt 273 lb (123.8 kg)   SpO2 97%   BMI 40.32 kg/m   Wt Readings from Last 3 Encounters:  03/01/24 273 lb (123.8 kg)  10/23/23 270 lb (122.5 kg)  07/21/23 267 lb (121.1 kg)    Physical Exam Vitals reviewed.  Constitutional:      General: He is not in acute distress.    Appearance: He is obese. He is not  ill-appearing.  HENT:     Head: Normocephalic and atraumatic.  Cardiovascular:     Rate and Rhythm: Normal rate and regular rhythm.  Pulmonary:     Effort: Pulmonary effort is normal.     Breath sounds: Normal breath sounds. No wheezing.  Abdominal:     General: Bowel sounds are normal.     Palpations: Abdomen is soft.     Tenderness: There is no abdominal tenderness.  Musculoskeletal:     Cervical back: Neck supple.     Right lower leg: No edema.     Left lower leg: No edema.  Lymphadenopathy:     Cervical: No cervical adenopathy.  Skin:    General: Skin is warm and dry.  Neurological:     Mental Status: He is alert and oriented to person, place, and time.  Psychiatric:        Behavior: Behavior normal.     Results for orders placed or performed during the hospital encounter of 02/27/24  Comprehensive metabolic panel with GFR   Collection Time: 02/27/24  8:10 AM  Result Value Ref Range   Sodium 137 135 - 145 mmol/L   Potassium 4.4 3.5 - 5.1 mmol/L   Chloride 104 98 -  111 mmol/L   CO2 25 22 - 32 mmol/L   Glucose, Bld 121 (H) 70 - 99 mg/dL   BUN 18 6 - 20 mg/dL   Creatinine, Ser 9.18 0.61 - 1.24 mg/dL   Calcium  8.9 8.9 - 10.3 mg/dL   Total Protein 6.9 6.5 - 8.1 g/dL   Albumin 3.9 3.5 - 5.0 g/dL   AST 31 15 - 41 U/L   ALT 31 0 - 44 U/L   Alkaline Phosphatase 73 38 - 126 U/L   Total Bilirubin 0.8 0.0 - 1.2 mg/dL   GFR, Estimated >39 >39 mL/min   Anion gap 8 5 - 15  Lipid panel   Collection Time: 02/27/24  8:10 AM  Result Value Ref Range   Cholesterol 112 0 - 200 mg/dL   Triglycerides 85 <849 mg/dL   HDL 40 (L) >59 mg/dL   Total CHOL/HDL Ratio 2.8 RATIO   VLDL 17 0 - 40 mg/dL   LDL Cholesterol 55 0 - 99 mg/dL  Hemoglobin J8r   Collection Time: 02/27/24  8:10 AM  Result Value Ref Range   Hgb A1c MFr Bld 7.3 (H) 4.8 - 5.6 %   Mean Plasma Glucose 162.81 mg/dL      Assessment & Plan:    Encounter Diagnoses  Name Primary?   Diabetes mellitus without  complication (HCC) Yes   Hyperlipidemia, unspecified hyperlipidemia type    BMI 40.0-44.9, adult (HCC)       -reviewed labs with pt -pt counseled to avoid running out of meds -discussed weight management with healthy diet and regular exercise -pt to follow up in three months.  He is to contact office sooner prn

## 2024-03-02 ENCOUNTER — Ambulatory Visit: Payer: Self-pay | Admitting: Physician Assistant

## 2024-05-17 ENCOUNTER — Other Ambulatory Visit: Payer: Self-pay | Admitting: Physician Assistant

## 2024-05-17 DIAGNOSIS — E119 Type 2 diabetes mellitus without complications: Secondary | ICD-10-CM

## 2024-05-28 ENCOUNTER — Other Ambulatory Visit (HOSPITAL_COMMUNITY)
Admission: RE | Admit: 2024-05-28 | Discharge: 2024-05-28 | Disposition: A | Payer: Self-pay | Source: Ambulatory Visit | Attending: Physician Assistant | Admitting: Physician Assistant

## 2024-05-28 DIAGNOSIS — E119 Type 2 diabetes mellitus without complications: Secondary | ICD-10-CM

## 2024-05-28 LAB — BASIC METABOLIC PANEL WITH GFR
Anion gap: 11 (ref 5–15)
BUN: 12 mg/dL (ref 6–20)
CO2: 23 mmol/L (ref 22–32)
Calcium: 9.2 mg/dL (ref 8.9–10.3)
Chloride: 105 mmol/L (ref 98–111)
Creatinine, Ser: 0.86 mg/dL (ref 0.61–1.24)
GFR, Estimated: >60 mL/min (ref >60)
Glucose, Bld: 115 mg/dL — ABNORMAL HIGH (ref 70–99)
Potassium: 4.6 mmol/L (ref 3.5–5.1)
Sodium: 140 mmol/L (ref 135–145)

## 2024-05-29 LAB — MICROALBUMIN, URINE: Microalb, Ur: 13.6 ug/mL — ABNORMAL HIGH

## 2024-05-29 LAB — HEMOGLOBIN A1C
Hgb A1c MFr Bld: 6.5 % — ABNORMAL HIGH (ref 4.8–5.6)
Mean Plasma Glucose: 140 mg/dL

## 2024-05-31 ENCOUNTER — Ambulatory Visit: Payer: Self-pay | Admitting: Physician Assistant

## 2024-05-31 ENCOUNTER — Other Ambulatory Visit: Payer: Self-pay | Admitting: Physician Assistant

## 2024-05-31 ENCOUNTER — Encounter: Payer: Self-pay | Admitting: Physician Assistant

## 2024-05-31 VITALS — BP 123/79 | HR 70 | Temp 97.4°F | Wt 272.0 lb

## 2024-05-31 DIAGNOSIS — E119 Type 2 diabetes mellitus without complications: Secondary | ICD-10-CM

## 2024-05-31 DIAGNOSIS — Z1211 Encounter for screening for malignant neoplasm of colon: Secondary | ICD-10-CM

## 2024-05-31 DIAGNOSIS — Z6841 Body Mass Index (BMI) 40.0 and over, adult: Secondary | ICD-10-CM

## 2024-05-31 DIAGNOSIS — E785 Hyperlipidemia, unspecified: Secondary | ICD-10-CM

## 2024-05-31 MED ORDER — GLIPIZIDE 5 MG PO TABS: 5.0000 mg | ORAL_TABLET | Freq: Every day | ORAL | 1 refills | Status: AC

## 2024-05-31 MED ORDER — JANUMET 50-500 MG PO TABS: 1.0000 | ORAL_TABLET | Freq: Two times a day (BID) | ORAL | 1 refills | Status: AC

## 2024-05-31 MED ORDER — ATORVASTATIN CALCIUM 20 MG PO TABS: 20.0000 mg | ORAL_TABLET | Freq: Every day | ORAL | 1 refills | Status: AC

## 2024-05-31 MED ORDER — LOSARTAN POTASSIUM 25 MG PO TABS: 25.0000 mg | ORAL_TABLET | Freq: Every day | ORAL | 1 refills | Status: AC

## 2024-05-31 NOTE — Progress Notes (Signed)
 BP 123/79   Pulse 70   Temp (!) 97.4 F (36.3 C)   Wt 272 lb (123.4 kg)   SpO2 96%   BMI 40.17 kg/m    Subjective:    Patient ID: Christopher Compton, male    DOB: [birth year], 48 y.o.   MRN: [id number]  HPI: Christopher Compton is a 48 y.o. male presenting on 05/31/2024 for No chief complaint on file.   HPI  Pt is 48yoM who is in today for routine recheck DM  Pt still working [employer].  It is going well.   He has some plegm in the morning.   Only since cold weather.  He feels good.  He got flu shot already.   Relevant past medical, surgical, family and social history reviewed and updated as indicated. Interim medical history since our last visit reviewed. Allergies and medications reviewed and updated.   Current Outpatient Medications:    atorvastatin  (LIPITOR) 20 MG tablet, Take 1 tablet (20 mg total) by mouth daily., Disp: 90 tablet, Rfl: 1   glipiZIDE  (GLUCOTROL ) 5 MG tablet, Take 1 tablet (5 mg total) by mouth daily. With evening meal, Disp: 90 tablet, Rfl: 1   losartan  (COZAAR ) 25 MG tablet, Take 1 tablet (25 mg total) by mouth daily., Disp: 90 tablet, Rfl: 1   sitaGLIPtin-metformin  (JANUMET ) 50-500 MG tablet, Take 1 tablet by mouth 2 (two) times daily with a meal., Disp: 180 tablet, Rfl: 1   psyllium (METAMUCIL) 58.6 % powder, Take 1 packet by mouth 2 (two) times daily. (Patient not taking: Reported on 03/01/2024), Disp: , Rfl:    Review of Systems  Per HPI unless specifically indicated above     Objective:    BP 123/79   Pulse 70   Temp (!) 97.4 F (36.3 C)   Wt 272 lb (123.4 kg)   SpO2 96%   BMI 40.17 kg/m   Wt Readings from Last 3 Encounters:  05/31/24 272 lb (123.4 kg)  03/01/24 273 lb (123.8 kg)  10/23/23 270 lb (122.5 kg)    Physical Exam Vitals reviewed.  Constitutional:      General: He is not in acute distress.    Appearance: He is well-developed. He is obese. He is not toxic-appearing.  HENT:     Head: Normocephalic and atraumatic.      Nose: No congestion.  Cardiovascular:     Rate and Rhythm: Normal rate and regular rhythm.  Pulmonary:     Effort: Pulmonary effort is normal. No respiratory distress.     Breath sounds: Normal breath sounds. No stridor. No wheezing, rhonchi or rales.  Abdominal:     General: Bowel sounds are normal.     Palpations: Abdomen is soft.     Tenderness: There is no abdominal tenderness.  Musculoskeletal:     Cervical back: Neck supple.     Right lower leg: No edema.     Left lower leg: No edema.  Lymphadenopathy:     Cervical: No cervical adenopathy.  Skin:    General: Skin is warm and dry.  Neurological:     Mental Status: He is alert and oriented to person, place, and time.  Psychiatric:        Behavior: Behavior normal.     Results for orders placed or performed during the hospital encounter of 05/28/24  Microalbumin, urine   Collection Time: 05/28/24  8:13 AM  Result Value Ref Range   Microalb, Ur 13.6 (H) Not Estab. ug/mL  Hemoglobin A1c  Collection Time: 05/28/24  8:13 AM  Result Value Ref Range   Hgb A1c MFr Bld 6.5 (H) 4.8 - 5.6 %   Mean Plasma Glucose 140 mg/dL  Basic metabolic panel with GFR   Collection Time: 05/28/24  8:13 AM  Result Value Ref Range   Sodium 140 135 - 145 mmol/L   Potassium 4.6 3.5 - 5.1 mmol/L   Chloride 105 98 - 111 mmol/L   CO2 23 22 - 32 mmol/L   Glucose, Bld 115 (H) 70 - 99 mg/dL   BUN 12 6 - 20 mg/dL   Creatinine, Ser 9.13 0.61 - 1.24 mg/dL   Calcium  9.2 8.9 - 10.3 mg/dL   GFR, Estimated >39 >39 mL/min   Anion gap 11 5 - 15      Assessment & Plan:    Encounter Diagnoses  Name Primary?   Diabetes mellitus without complication (HCC) Yes   Hyperlipidemia, unspecified hyperlipidemia type    BMI 40.0-44.9, adult (HCC)    Screening for colon cancer      -reviewed labs with pt  -no changes to medication -will refer for Dm eye exam  -pt was given FIT test for colon cancer screening -discussed things to help his phlegm including  warm salt water gargles, elevating head of bed/using extra pillow or taking mucinex -pt to follow up three months. He is to contact office sooner prn

## 2024-06-03 ENCOUNTER — Telehealth: Payer: Self-pay

## 2024-06-03 ENCOUNTER — Ambulatory Visit: Payer: Self-pay | Admitting: Physician Assistant

## 2024-06-03 NOTE — Telephone Encounter (Signed)
 Attempted follow up call to [facility] client with [facility]. NO answer, left message requesting to contact office if there any resources or assistance he needs and number provided.   Avelina JONELLE Skeen RN [facility] [employer]

## 2024-06-21 ENCOUNTER — Ambulatory Visit: Payer: Self-pay | Admitting: Physician Assistant

## 2024-06-21 LAB — POC FIT TEST STOOL: Fecal Occult Blood: NEGATIVE

## 2024-08-30 ENCOUNTER — Ambulatory Visit: Payer: Self-pay | Admitting: Physician Assistant
